# Patient Record
Sex: Female | Born: 1985 | Race: Black or African American | Hispanic: No | Marital: Single | Smoking: Former smoker
Health system: Southern US, Community
[De-identification: ages and names within clinical notes are randomized; demographics above are authoritative.]

## PROBLEM LIST (undated history)

## (undated) ENCOUNTER — Inpatient Hospital Stay (HOSPITAL_COMMUNITY): Payer: Self-pay

## (undated) DIAGNOSIS — R87619 Unspecified abnormal cytological findings in specimens from cervix uteri: Secondary | ICD-10-CM

## (undated) DIAGNOSIS — E7800 Pure hypercholesterolemia, unspecified: Secondary | ICD-10-CM

## (undated) DIAGNOSIS — E669 Obesity, unspecified: Secondary | ICD-10-CM

## (undated) HISTORY — DX: Pure hypercholesterolemia, unspecified: E78.00

## (undated) HISTORY — DX: Unspecified abnormal cytological findings in specimens from cervix uteri: R87.619

## (undated) HISTORY — DX: Obesity, unspecified: E66.9

---
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
## 2010-08-18 ENCOUNTER — Inpatient Hospital Stay (INDEPENDENT_AMBULATORY_CARE_PROVIDER_SITE_OTHER)
Admission: RE | Admit: 2010-08-18 | Discharge: 2010-08-18 | Disposition: A | Discharge status: Home / Self Care | Payer: PRIVATE HEALTH INSURANCE | Source: Ambulatory Visit | Attending: Family Medicine | Admitting: Family Medicine

## 2010-08-18 DIAGNOSIS — N946 Dysmenorrhea, unspecified: Secondary | ICD-10-CM

## 2010-08-18 LAB — POCT URINALYSIS DIP (DEVICE)
Bilirubin Urine: NEGATIVE
Ketones, ur: NEGATIVE mg/dL
Nitrite: NEGATIVE
Protein, ur: 30 mg/dL — AB

## 2010-08-18 LAB — POCT PREGNANCY, URINE: Preg Test, Ur: NEGATIVE

## 2010-08-19 LAB — GC/CHLAMYDIA PROBE AMP, GENITAL: Chlamydia, DNA Probe: NEGATIVE

## 2011-03-29 ENCOUNTER — Encounter: Payer: Self-pay | Admitting: *Deleted

## 2011-03-29 ENCOUNTER — Emergency Department (INDEPENDENT_AMBULATORY_CARE_PROVIDER_SITE_OTHER)
Admission: EM | Admit: 2011-03-29 | Discharge: 2011-03-29 | Disposition: A | Discharge status: Home / Self Care | Payer: Self-pay | Source: Home / Self Care | Attending: Emergency Medicine | Admitting: Emergency Medicine

## 2011-03-29 DIAGNOSIS — J069 Acute upper respiratory infection, unspecified: Secondary | ICD-10-CM

## 2011-03-29 MED ORDER — ACETAMINOPHEN-CODEINE #3 300-30 MG PO TABS: 1.0000 | ORAL_TABLET | Freq: Four times a day (QID) | ORAL | Status: AC | PRN

## 2011-03-29 NOTE — ED Notes (Signed)
pT  HAS  SYMPTOMS  OF  COUGH/  CONGESTION   FEVER  CHILLS  NASAL  STUFFYNESS  AS  WELL AS   DIARRHEA  AND  SOME  STOMACH CRAMPS       SYMPTOMS  X  3  DAYS

## 2011-03-29 NOTE — ED Provider Notes (Signed)
History     CSN: [id number] Arrival date & time: 03/29/2011  8:18 PM   First MD Initiated Contact with Patient 03/29/11 1934      Chief Complaint  Patient presents with  . Cough    (Consider location/radiation/quality/duration/timing/severity/associated sxs/prior treatment) HPI Comments: CONGESTION AND FEVERS X 2-3 DAYS, RUNNY NOSE AND 1-2 EPISODES OF DIARRHEA  Patient is a 25 y.o. female presenting with cough. The history is provided by the patient.  Cough This is a new problem. The current episode started more than 2 days ago. The problem has not changed since onset.The cough is non-productive. There has been no fever. Associated symptoms include chills, ear congestion, headaches and rhinorrhea. Pertinent negatives include no sweats, no ear pain, no shortness of breath and no wheezing. She is not a smoker. Her past medical history does not include bronchitis or asthma.    History reviewed. No pertinent past medical history.  History reviewed. No pertinent past surgical history.  Family History  Problem Relation Age of Onset  . Diabetes Mother   . Hypertension Mother     History  Substance Use Topics  . Smoking status: Current Some Day Smoker  . Smokeless tobacco: Not on file  . Alcohol Use: No    OB History    Grav Para Term Preterm Abortions TAB SAB Ect Mult Living                  Review of Systems  Constitutional: Positive for fever and chills.  HENT: Positive for rhinorrhea. Negative for ear pain.   Respiratory: Positive for cough. Negative for shortness of breath and wheezing.   Neurological: Positive for headaches.    Allergies  Review of patient's allergies indicates no known allergies.  Home Medications   Current Outpatient Rx  Name Route Sig Dispense Refill  . ACETAMINOPHEN 325 MG PO TABS Oral Take 650 mg by mouth every 6 (six) hours as needed.      . ACETAMINOPHEN-CODEINE #3 300-30 MG PO TABS Oral Take 1-2 tablets by mouth every 6 (six) hours as  needed for pain. 15 tablet 0    BP 139/92  Pulse 89  Temp(Src) 99.5 F (37.5 C) (Oral)  Resp 18  SpO2 100%  LMP 03/29/2011  Physical Exam  Nursing note and vitals reviewed. Constitutional: She appears well-developed and well-nourished.  Non-toxic appearance. No distress.  HENT:  Head: Normocephalic.  Right Ear: Tympanic membrane and ear canal normal.  Left Ear: Tympanic membrane and ear canal normal.  Nose: Rhinorrhea present.  Mouth/Throat: Uvula is midline. Posterior oropharyngeal erythema present. No oropharyngeal exudate, posterior oropharyngeal edema or tonsillar abscesses.  Eyes: Conjunctivae are normal. No scleral icterus.  Neck: Normal range of motion. No JVD present.  Cardiovascular: Normal rate.   Pulmonary/Chest: Effort normal and breath sounds normal. She has no decreased breath sounds. She has no wheezes. She has no rhonchi.  Abdominal: Soft.  Lymphadenopathy:    She has no cervical adenopathy.  Neurological: She is alert.  Skin: Skin is warm. No abrasion noted.    ED Course  Procedures (including critical care time)  Labs Reviewed - No data to display No results found.   1. Upper respiratory infection       MDM  URI- X 3 DAYS AFEBRILE. NORMAL Rogene Houston, MD 03/29/11 2042

## 2011-04-19 NOTE — L&D Delivery Note (Signed)
Delivery Note At 12:51 AM a viable female was delivered via Vaginal, Spontaneous Delivery (Presentation: ;  ).  APGAR: , ; weight .   Placenta status: , .  Cord:  with the following complications: .  Cord pH: not done  Anesthesia:   Episiotomy:  Lacerations:  Suture Repair: 2.0 vicryl Est. Blood Loss (mL):   Mom to postpartum.  Baby to nursery-stable.  MARSHALL,BERNARD A 02/28/2012, 1:00 AM

## 2011-05-17 ENCOUNTER — Emergency Department (INDEPENDENT_AMBULATORY_CARE_PROVIDER_SITE_OTHER)
Admission: EM | Admit: 2011-05-17 | Discharge: 2011-05-17 | Disposition: A | Discharge status: Home / Self Care | Payer: PRIVATE HEALTH INSURANCE | Source: Home / Self Care | Attending: Family Medicine | Admitting: Family Medicine

## 2011-05-17 ENCOUNTER — Encounter (HOSPITAL_COMMUNITY): Payer: Self-pay | Admitting: *Deleted

## 2011-05-17 DIAGNOSIS — A6000 Herpesviral infection of urogenital system, unspecified: Secondary | ICD-10-CM

## 2011-05-17 DIAGNOSIS — K649 Unspecified hemorrhoids: Secondary | ICD-10-CM

## 2011-05-17 DIAGNOSIS — B009 Herpesviral infection, unspecified: Secondary | ICD-10-CM

## 2011-05-17 DIAGNOSIS — N898 Other specified noninflammatory disorders of vagina: Secondary | ICD-10-CM

## 2011-05-17 LAB — POCT URINALYSIS DIP (DEVICE)
Glucose, UA: NEGATIVE mg/dL
Nitrite: NEGATIVE
Protein, ur: NEGATIVE mg/dL
Urobilinogen, UA: 0.2 mg/dL (ref 0.0–1.0)

## 2011-05-17 LAB — WET PREP, GENITAL

## 2011-05-17 MED ORDER — VALACYCLOVIR HCL 1 G PO TABS: 1000.0000 mg | ORAL_TABLET | Freq: Two times a day (BID) | ORAL | Status: DC

## 2011-05-17 NOTE — ED Provider Notes (Signed)
Jasmine Krause is a 26 y.o. female who presents to Urgent Care today for hemorrhoids and vaginal discomfort.  She noted hemorrhoids on her anus the last 2 days. She describes a soft bump associated with some pain when defecating. She denies any firm or hard  stools or straining.  She denies any significant pain at rest or itching.  She notes some vaginal pain over the last few days. She has the pain is more external than internal and denies any significant discharge. Her last menstrual period was January 10.  She feels well otherwise without any abdominal pain fever chill or trouble breathing.   PMH reviewed.  ROS as above otherwise neg Medications reviewed. No current facility-administered medications for this encounter.   Current Outpatient Prescriptions  Medication Sig Dispense Refill  . acetaminophen (TYLENOL) 325 MG tablet Take 650 mg by mouth every 6 (six) hours as needed.        . valACYclovir (VALTREX) 1000 MG tablet Take 1 tablet (1,000 mg total) by mouth 2 (two) times daily.  20 tablet  0    Exam:  BP 171/94  Pulse 98  Temp(Src) 99.2 F (37.3 C) (Oral)  Resp 20  SpO2 100%  LMP 04/28/2011 Gen: Well NAD HEENT: EOMI,  MMM Lungs: CTABL Nl WOB Heart: RRR no MRG Abd: NABS, NT, ND GYN: Small circular tender lesion on left labia.  Otherwise normal external genitalia.  Vaginal canal is normal with scant white discharge. Cervix is normal appearing. Anus: Soft nonthrombosed external hemorrhoids present  Assessment and Plan: 26 year old woman with 1) external hemorrhoid.  Advised stool softeners and watchful waiting.  Encouraged to establish primary care doctor for this and other issues.  2) likely herpes: Her pain description and exam is consistent with genital herpes.  Viral culture obtained as well as gonorrhea Chlamydia and wet prep.  Treating empirically with Valtrex 1000 mg twice daily for 10 days.  Encourage her to follow up with her primary care doctor for further  care.     Clementeen Graham, MD 05/17/11 2020

## 2011-05-17 NOTE — ED Notes (Signed)
Pt  Reports     Vaginal  As  Well  As  Pain  In  Her  Rectal  Area    X  2  Days  She  denys  Any  Vaginal  Bleeding  Or  Any    Discharge   She        Is  Awake   And  Alert               Symptoms  X  2  Days   She    Is  Walking upright  And  Appears  In no  Severe  Distress

## 2011-05-18 NOTE — ED Provider Notes (Signed)
Medical screening examination/treatment/procedure(s) were performed by PGY-3 [facility] resident and as supervising physician I was immediately available for consultation/collaboration.   [facility]; MD   Sharin Grave, MD 05/18/11 [id number]

## 2011-05-19 LAB — HERPES SIMPLEX VIRUS CULTURE: Culture: DETECTED

## 2011-05-20 ENCOUNTER — Telehealth (HOSPITAL_COMMUNITY): Payer: Self-pay | Admitting: *Deleted

## 2011-05-20 LAB — GC/CHLAMYDIA PROBE AMP, GENITAL: GC Probe Amp, Genital: NEGATIVE

## 2011-05-20 MED ORDER — FLUCONAZOLE 150 MG PO TABS: 150.0000 mg | ORAL_TABLET | Freq: Once | ORAL | Status: AC

## 2011-05-20 MED ORDER — AZITHROMYCIN 250 MG PO TABS: ORAL_TABLET | ORAL | Status: AC

## 2011-05-20 MED ORDER — METRONIDAZOLE 500 MG PO TABS: 500.0000 mg | ORAL_TABLET | Freq: Two times a day (BID) | ORAL | Status: AC

## 2011-05-20 NOTE — ED Notes (Signed)
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 adequately treated with Acyclovir for Herpes.  Orders obtained from Dr. Juanetta Gosling for Rx.'s of Flagyl, Diflucan and Zithromax. Pt. called back and verified x 2. Pt. given results and told she was adeq. treated with the Acyclovir but needs the 3 other Rx.'s Pt. told no alcohol while taking the Flagyl and to finish Acyclovir for Herpes. Pt. told she can shed the virus even when she does not have an outbreak, so always make her partner wear a condom. Pt. told to get treated for each outbreak and it would be benficial to have an OB-GYN she can call for refill of Acyclovir when she has an outbreak. Pt. instructed to notify her partner about Chlamydia and Herpes, no sex for 1 week and to practice safe sex. Pt. wants Rx.'s called to [facility] on [address]. Rx.'s called to pharmacist @ [phone]. Vassie Moselle 05/20/2011

## 2011-05-20 NOTE — ED Notes (Signed)
Attempted to contact patient at number provided; female answered phone, and asked if we could call back after 6 pm tonight

## 2011-05-23 NOTE — ED Notes (Signed)
DHHS form completed and faxed to the [facility]. Jasmine Krause 05/23/2011

## 2011-05-25 ENCOUNTER — Telehealth: Payer: Self-pay | Admitting: Family Medicine

## 2011-05-25 NOTE — Telephone Encounter (Signed)
Message copied by Rodolph Bong on Wed May 25, 2011  8:26 AM ------      Message from: Vassie Moselle      Created: Thu May 19, 2011  4:47 PM       Pt. adeq. treated for Herpes with Acyclovir.  Pt. has many clue cells and few yeast. Does pt. need tx. for this? GC/chlamydia pending.      Vassie Moselle      05/19/2011

## 2011-09-13 LAB — OB RESULTS CONSOLE GC/CHLAMYDIA
Chlamydia: NEGATIVE
Gonorrhea: NEGATIVE

## 2011-09-13 LAB — OB RESULTS CONSOLE HEPATITIS B SURFACE ANTIGEN: Hepatitis B Surface Ag: NEGATIVE

## 2011-10-02 ENCOUNTER — Encounter (HOSPITAL_COMMUNITY): Payer: Self-pay | Admitting: *Deleted

## 2011-10-02 ENCOUNTER — Inpatient Hospital Stay (HOSPITAL_COMMUNITY)
Admission: AD | Admit: 2011-10-02 | Discharge: 2011-10-02 | Disposition: A | Discharge status: Home / Self Care | Payer: PRIVATE HEALTH INSURANCE | Source: Ambulatory Visit | Attending: Obstetrics | Admitting: Obstetrics

## 2011-10-02 DIAGNOSIS — R112 Nausea with vomiting, unspecified: Secondary | ICD-10-CM

## 2011-10-02 DIAGNOSIS — O210 Mild hyperemesis gravidarum: Secondary | ICD-10-CM | POA: Insufficient documentation

## 2011-10-02 DIAGNOSIS — O219 Vomiting of pregnancy, unspecified: Secondary | ICD-10-CM

## 2011-10-02 DIAGNOSIS — R109 Unspecified abdominal pain: Secondary | ICD-10-CM | POA: Insufficient documentation

## 2011-10-02 DIAGNOSIS — R197 Diarrhea, unspecified: Secondary | ICD-10-CM

## 2011-10-02 DIAGNOSIS — O99891 Other specified diseases and conditions complicating pregnancy: Secondary | ICD-10-CM | POA: Insufficient documentation

## 2011-10-02 LAB — COMPREHENSIVE METABOLIC PANEL
BUN: 5 mg/dL — ABNORMAL LOW (ref 6–23)
CO2: 24 mEq/L (ref 19–32)
Calcium: 8.6 mg/dL (ref 8.4–10.5)
Creatinine, Ser: 0.52 mg/dL (ref 0.50–1.10)
GFR calc Af Amer: >90 mL/min (ref >90)
GFR calc non Af Amer: >90 mL/min (ref >90)
Glucose, Bld: 88 mg/dL (ref 70–99)
Sodium: 136 mEq/L (ref 135–145)
Total Protein: 6.3 g/dL (ref 6.0–8.3)

## 2011-10-02 LAB — CBC
HCT: 33.6 % — ABNORMAL LOW (ref 36.0–46.0)
Hemoglobin: 10.9 g/dL — ABNORMAL LOW (ref 12.0–15.0)
MCHC: 32.4 g/dL (ref 30.0–36.0)
RBC: 4.09 MIL/uL (ref 3.87–5.11)
WBC: 10.8 10*3/uL — ABNORMAL HIGH (ref 4.0–10.5)

## 2011-10-02 LAB — URINALYSIS, ROUTINE W REFLEX MICROSCOPIC
Bilirubin Urine: NEGATIVE
Glucose, UA: NEGATIVE mg/dL
Hgb urine dipstick: NEGATIVE
Ketones, ur: 15 mg/dL — AB
Protein, ur: NEGATIVE mg/dL
Urobilinogen, UA: 0.2 mg/dL (ref 0.0–1.0)

## 2011-10-02 MED ORDER — LACTATED RINGERS IV BOLUS (SEPSIS)
1000.0000 mL | Freq: Once | INTRAVENOUS | Status: AC
  Administered 2011-10-02: 1000 mL via INTRAVENOUS

## 2011-10-02 MED ORDER — PROMETHAZINE HCL 25 MG PO TABS: 12.5000 mg | ORAL_TABLET | Freq: Four times a day (QID) | ORAL | Status: DC | PRN

## 2011-10-02 MED ORDER — ONDANSETRON HCL 4 MG/2ML IJ SOLN
4.0000 mg | Freq: Once | INTRAMUSCULAR | Status: AC
  Administered 2011-10-02: 4 mg via INTRAVENOUS
  Filled 2011-10-02: qty 2

## 2011-10-02 NOTE — MAU Note (Signed)
Plan of care explained to patient, pt verbalized an understanding.

## 2011-10-02 NOTE — Discharge Instructions (Signed)
B.R.A.T. Diet Your doctor has recommended the B.R.A.T. diet for you until the condition improves. This is often used to help control diarrhea and vomiting symptoms. If you or your child can tolerate clear liquids, you may have:  Bananas.   Rice.   Applesauce.   Toast (and other simple starches such as crackers, potatoes, noodles).  Be sure to avoid dairy products, meats, and fatty foods until symptoms are better. Fruit juices such as apple, grape, and prune juice can make diarrhea worse. Avoid these. Continue this diet for 2 days or as instructed by your caregiver. Document Released: 04/04/2005 Document Revised: 03/24/2011 Document Reviewed: 09/21/2006 [facility] Patient Information 2012 [place], [place].

## 2011-10-02 NOTE — MAU Note (Signed)
Pt went to [employer] lastnight and had [place] rice shrimp and chicken.  Pt reports started having N/V about midnight last nigh and has had it off and on all day.

## 2011-10-02 NOTE — MAU Provider Note (Signed)
History     CSN: [id number]  Arrival date & time 10/02/11  1453   None     Chief Complaint  Patient presents with  . Abdominal Cramping   HPI Jasmine Krause is a 26 y.o. female @ [redacted]w[redacted]d gestation who presents to MAU for nausea, vomiting and diarrhea. She ate shrimp, chicken and rice last night and started feeling sick a few hours later. She thinks she may have food poisioning. Vomited x 3 last pm and x 1 today. Had 3 brown watery stools last night and one today.  Denies any other problems. The history was provided by the patient. No past medical history on file.  No past surgical history on file.  Family History  Problem Relation Age of Onset  . Diabetes Mother   . Hypertension Mother   . Other Neg Hx     History  Substance Use Topics  . Smoking status: Current Some Day Smoker  . Smokeless tobacco: Not on file  . Alcohol Use: No    OB History    Grav Para Term Preterm Abortions TAB SAB Ect Mult Living   1               Review of Systems  Constitutional: Negative for fever, chills, diaphoresis and fatigue.  HENT: Negative for ear pain, congestion, sore throat, facial swelling, neck pain, neck stiffness, dental problem and sinus pressure.   Eyes: Negative for photophobia, pain and discharge.  Respiratory: Negative for cough, chest tightness and wheezing.   Gastrointestinal: Positive for nausea, vomiting and diarrhea. Negative for abdominal pain, constipation and abdominal distention.  Genitourinary: Negative for dysuria, frequency, flank pain, vaginal bleeding, vaginal discharge, difficulty urinating and pelvic pain.  Musculoskeletal: Negative for myalgias, back pain and gait problem.  Skin: Negative for color change and rash.  Neurological: Negative for dizziness, speech difficulty, weakness, light-headedness, numbness and headaches.  Psychiatric/Behavioral: Negative for confusion and agitation. The patient is not nervous/anxious.     Allergies  Review of patient's  allergies indicates no known allergies.  Home Medications  No current outpatient prescriptions on file.  BP 133/72  Pulse 131  Temp 99.1 F (37.3 C) (Oral)  Resp 18  Ht 5\' 8"  (1.727 m)  Wt 309 lb 3.2 oz (140.252 kg)  BMI 47.01 kg/m2  LMP 04/28/2011  Physical Exam  Nursing note and vitals reviewed. Constitutional: She is oriented to person, place, and time. No distress.       obese  HENT:  Head: Normocephalic.  Eyes: EOM are normal.  Neck: Neck supple.  Cardiovascular:       tachycardia  Pulmonary/Chest: Effort normal.  Abdominal: Soft. There is no tenderness.       obese  Musculoskeletal: Normal range of motion.  Neurological: She is alert and oriented to person, place, and time. No cranial nerve deficit.  Skin: Skin is warm and dry.  Psychiatric: She has a normal mood and affect. Her behavior is normal. Judgment and thought content normal.   Results for orders placed during the hospital encounter of 10/02/11 (from the past 24 hour(s))  URINALYSIS, ROUTINE W REFLEX MICROSCOPIC     Status: Abnormal   Collection Time   10/02/11  3:10 PM      Component Value Range   Color, Urine YELLOW  YELLOW   APPearance CLEAR  CLEAR   Specific Gravity, Urine 1.010  1.005 - 1.030   pH 6.5  5.0 - 8.0   Glucose, UA NEGATIVE  NEGATIVE mg/dL  Hgb urine dipstick NEGATIVE  NEGATIVE   Bilirubin Urine NEGATIVE  NEGATIVE   Ketones, ur 15 (*) NEGATIVE mg/dL   Protein, ur NEGATIVE  NEGATIVE mg/dL   Urobilinogen, UA 0.2  0.0 - 1.0 mg/dL   Nitrite NEGATIVE  NEGATIVE   Leukocytes, UA NEGATIVE  NEGATIVE  COMPREHENSIVE METABOLIC PANEL     Status: Abnormal   Collection Time   10/02/11  4:07 PM      Component Value Range   Sodium 136  135 - 145 mEq/L   Potassium 3.7  3.5 - 5.1 mEq/L   Chloride 102  96 - 112 mEq/L   CO2 24  19 - 32 mEq/L   Glucose, Bld 88  70 - 99 mg/dL   BUN 5 (*) 6 - 23 mg/dL   Creatinine, Ser 4.09  0.50 - 1.10 mg/dL   Calcium 8.6  8.4 - 81.1 mg/dL   Total Protein 6.3  6.0  - 8.3 g/dL   Albumin 2.8 (*) 3.5 - 5.2 g/dL   AST 17  0 - 37 U/L   ALT 21  0 - 35 U/L   Alkaline Phosphatase 70  39 - 117 U/L   Total Bilirubin 0.2 (*) 0.3 - 1.2 mg/dL   GFR calc non Af Amer >90  >90 mL/min   GFR calc Af Amer >90  >90 mL/min  CBC     Status: Abnormal   Collection Time   10/02/11  4:07 PM      Component Value Range   WBC 10.8 (*) 4.0 - 10.5 K/uL   RBC 4.09  3.87 - 5.11 MIL/uL   Hemoglobin 10.9 (*) 12.0 - 15.0 g/dL   HCT 91.4 (*) 78.2 - 95.6 %   MCV 82.2  78.0 - 100.0 fL   MCH 26.7  26.0 - 34.0 pg   MCHC 32.4  30.0 - 36.0 g/dL   RDW 21.3  08.6 - 57.8 %   Platelets 281  150 - 400 K/uL   Assessment: Nausea, vomiting and diarrhea  Plan:  IV hydration   Zofran IV   ED Course: 16:40 patient feeling much better, no nausea at this time, has had no vomiting or diarrhea since arrival to MAU.  Procedures  Will d/c patient home with Rx for Phenergan and she will follow up with Dr. Gaynell Face. She will return here as needed. MDM

## 2011-10-13 ENCOUNTER — Other Ambulatory Visit (HOSPITAL_COMMUNITY): Payer: Self-pay | Admitting: Obstetrics

## 2011-10-13 DIAGNOSIS — Z3689 Encounter for other specified antenatal screening: Secondary | ICD-10-CM

## 2011-10-18 ENCOUNTER — Ambulatory Visit (HOSPITAL_COMMUNITY)
Admission: RE | Admit: 2011-10-18 | Discharge: 2011-10-18 | Disposition: A | Discharge status: Home / Self Care | Payer: No Typology Code available for payment source | Source: Ambulatory Visit | Attending: Obstetrics | Admitting: Obstetrics

## 2011-10-18 DIAGNOSIS — Z3689 Encounter for other specified antenatal screening: Secondary | ICD-10-CM

## 2011-10-18 DIAGNOSIS — Z363 Encounter for antenatal screening for malformations: Secondary | ICD-10-CM | POA: Insufficient documentation

## 2011-10-18 DIAGNOSIS — Z1389 Encounter for screening for other disorder: Secondary | ICD-10-CM | POA: Insufficient documentation

## 2011-10-18 DIAGNOSIS — O358XX0 Maternal care for other (suspected) fetal abnormality and damage, not applicable or unspecified: Secondary | ICD-10-CM | POA: Insufficient documentation

## 2011-11-11 ENCOUNTER — Other Ambulatory Visit (HOSPITAL_COMMUNITY): Payer: Self-pay | Admitting: Obstetrics

## 2011-11-11 DIAGNOSIS — Z1389 Encounter for screening for other disorder: Secondary | ICD-10-CM

## 2011-11-16 ENCOUNTER — Ambulatory Visit (HOSPITAL_COMMUNITY)
Admission: RE | Admit: 2011-11-16 | Discharge: 2011-11-16 | Disposition: A | Discharge status: Home / Self Care | Payer: No Typology Code available for payment source | Source: Ambulatory Visit | Attending: Obstetrics | Admitting: Obstetrics

## 2011-11-16 DIAGNOSIS — Z1389 Encounter for screening for other disorder: Secondary | ICD-10-CM

## 2011-11-16 DIAGNOSIS — Z3689 Encounter for other specified antenatal screening: Secondary | ICD-10-CM | POA: Insufficient documentation

## 2011-12-12 ENCOUNTER — Other Ambulatory Visit (HOSPITAL_COMMUNITY): Payer: Self-pay | Admitting: Obstetrics

## 2011-12-12 DIAGNOSIS — Z0489 Encounter for examination and observation for other specified reasons: Secondary | ICD-10-CM

## 2011-12-16 ENCOUNTER — Ambulatory Visit (HOSPITAL_COMMUNITY)
Admission: RE | Admit: 2011-12-16 | Discharge: 2011-12-16 | Disposition: A | Discharge status: Home / Self Care | Payer: No Typology Code available for payment source | Source: Ambulatory Visit | Attending: Obstetrics | Admitting: Obstetrics

## 2011-12-16 DIAGNOSIS — Z0489 Encounter for examination and observation for other specified reasons: Secondary | ICD-10-CM

## 2011-12-16 DIAGNOSIS — Z3689 Encounter for other specified antenatal screening: Secondary | ICD-10-CM | POA: Insufficient documentation

## 2011-12-16 DIAGNOSIS — E669 Obesity, unspecified: Secondary | ICD-10-CM | POA: Insufficient documentation

## 2012-01-20 ENCOUNTER — Other Ambulatory Visit (HOSPITAL_COMMUNITY): Payer: Self-pay | Admitting: Obstetrics

## 2012-01-20 DIAGNOSIS — IMO0002 Reserved for concepts with insufficient information to code with codable children: Secondary | ICD-10-CM

## 2012-01-24 ENCOUNTER — Ambulatory Visit (HOSPITAL_COMMUNITY)
Admission: RE | Admit: 2012-01-24 | Discharge: 2012-01-24 | Disposition: A | Discharge status: Home / Self Care | Payer: No Typology Code available for payment source | Source: Ambulatory Visit | Attending: Obstetrics | Admitting: Obstetrics

## 2012-01-24 DIAGNOSIS — Z3689 Encounter for other specified antenatal screening: Secondary | ICD-10-CM | POA: Insufficient documentation

## 2012-01-24 DIAGNOSIS — IMO0002 Reserved for concepts with insufficient information to code with codable children: Secondary | ICD-10-CM

## 2012-01-24 DIAGNOSIS — E669 Obesity, unspecified: Secondary | ICD-10-CM | POA: Insufficient documentation

## 2012-02-23 ENCOUNTER — Telehealth (HOSPITAL_COMMUNITY): Payer: Self-pay | Admitting: *Deleted

## 2012-02-23 ENCOUNTER — Encounter (HOSPITAL_COMMUNITY): Payer: Self-pay | Admitting: *Deleted

## 2012-02-23 NOTE — Telephone Encounter (Signed)
Preadmission screen  

## 2012-02-27 ENCOUNTER — Inpatient Hospital Stay (HOSPITAL_COMMUNITY)
Admission: RE | Admit: 2012-02-27 | Discharge: 2012-03-01 | DRG: 775 | Disposition: A | Discharge status: Home / Self Care | Payer: No Typology Code available for payment source | Source: Ambulatory Visit | Attending: Obstetrics | Admitting: Obstetrics

## 2012-02-27 ENCOUNTER — Encounter (HOSPITAL_COMMUNITY): Payer: Self-pay

## 2012-02-27 ENCOUNTER — Encounter (HOSPITAL_COMMUNITY): Payer: Self-pay | Admitting: Anesthesiology

## 2012-02-27 ENCOUNTER — Inpatient Hospital Stay (HOSPITAL_COMMUNITY): Payer: No Typology Code available for payment source | Admitting: Anesthesiology

## 2012-02-27 DIAGNOSIS — Z22330 Carrier of Group B streptococcus: Secondary | ICD-10-CM

## 2012-02-27 DIAGNOSIS — O99892 Other specified diseases and conditions complicating childbirth: Principal | ICD-10-CM | POA: Diagnosis present

## 2012-02-27 LAB — CBC
HCT: 33.9 % — ABNORMAL LOW (ref 36.0–46.0)
Hemoglobin: 10.9 g/dL — ABNORMAL LOW (ref 12.0–15.0)
RBC: 4.31 MIL/uL (ref 3.87–5.11)
WBC: 12.9 10*3/uL — ABNORMAL HIGH (ref 4.0–10.5)

## 2012-02-27 LAB — RPR: RPR Ser Ql: NONREACTIVE

## 2012-02-27 LAB — TYPE AND SCREEN: ABO/RH(D): B POS

## 2012-02-27 MED ORDER — EPHEDRINE 5 MG/ML INJ: 10.0000 mg | INTRAVENOUS | Status: DC | PRN

## 2012-02-27 MED ORDER — LACTATED RINGERS IV SOLN
500.0000 mL | Freq: Once | INTRAVENOUS | Status: AC
  Administered 2012-02-27: 08:00:00 via INTRAVENOUS

## 2012-02-27 MED ORDER — EPHEDRINE 5 MG/ML INJ
10.0000 mg | INTRAVENOUS | Status: DC | PRN
  Filled 2012-02-27: qty 4

## 2012-02-27 MED ORDER — PHENYLEPHRINE 40 MCG/ML (10ML) SYRINGE FOR IV PUSH (FOR BLOOD PRESSURE SUPPORT): 80.0000 ug | PREFILLED_SYRINGE | INTRAVENOUS | Status: DC | PRN

## 2012-02-27 MED ORDER — BUTORPHANOL TARTRATE 1 MG/ML IJ SOLN: 1.0000 mg | INTRAMUSCULAR | Status: DC | PRN

## 2012-02-27 MED ORDER — SODIUM BICARBONATE 8.4 % IV SOLN
INTRAVENOUS | Status: DC | PRN
  Administered 2012-02-27: 5 mL via EPIDURAL

## 2012-02-27 MED ORDER — PENICILLIN G POTASSIUM 5000000 UNITS IJ SOLR
2.5000 10*6.[IU] | INTRAVENOUS | Status: DC
  Administered 2012-02-27 – 2012-02-28 (×4): 2.5 10*6.[IU] via INTRAVENOUS
  Filled 2012-02-27 (×8): qty 2.5

## 2012-02-27 MED ORDER — LACTATED RINGERS IV SOLN: 500.0000 mL | Freq: Once | INTRAVENOUS | Status: DC

## 2012-02-27 MED ORDER — DIPHENHYDRAMINE HCL 50 MG/ML IJ SOLN: 12.5000 mg | INTRAMUSCULAR | Status: DC | PRN

## 2012-02-27 MED ORDER — PHENYLEPHRINE 40 MCG/ML (10ML) SYRINGE FOR IV PUSH (FOR BLOOD PRESSURE SUPPORT)
80.0000 ug | PREFILLED_SYRINGE | INTRAVENOUS | Status: DC | PRN
  Filled 2012-02-27: qty 5

## 2012-02-27 MED ORDER — TERBUTALINE SULFATE 1 MG/ML IJ SOLN: 0.2500 mg | Freq: Once | INTRAMUSCULAR | Status: AC | PRN

## 2012-02-27 MED ORDER — OXYTOCIN 40 UNITS IN LACTATED RINGERS INFUSION - SIMPLE MED
1.0000 m[IU]/min | INTRAVENOUS | Status: DC
  Administered 2012-02-27: 1 m[IU]/min via INTRAVENOUS
  Filled 2012-02-27: qty 1000

## 2012-02-27 MED ORDER — FENTANYL 2.5 MCG/ML BUPIVACAINE 1/10 % EPIDURAL INFUSION (WH - ANES)
INTRAMUSCULAR | Status: DC | PRN
  Administered 2012-02-27: 14 mL/h via EPIDURAL

## 2012-02-27 MED ORDER — FENTANYL 2.5 MCG/ML BUPIVACAINE 1/10 % EPIDURAL INFUSION (WH - ANES): 14.0000 mL/h | INTRAMUSCULAR | Status: DC

## 2012-02-27 MED ORDER — FENTANYL 2.5 MCG/ML BUPIVACAINE 1/10 % EPIDURAL INFUSION (WH - ANES)
14.0000 mL/h | INTRAMUSCULAR | Status: DC
  Filled 2012-02-27: qty 125

## 2012-02-27 MED ORDER — PENICILLIN G POTASSIUM 5000000 UNITS IJ SOLR
5.0000 10*6.[IU] | Freq: Once | INTRAVENOUS | Status: AC
  Administered 2012-02-27: 5 10*6.[IU] via INTRAVENOUS
  Filled 2012-02-27: qty 5

## 2012-02-27 NOTE — Progress Notes (Signed)
Patient ID: Jasmine Krause, female   DOB: 04/13/1986, 26 y.o.   MRN: [id number] Pt now fully dilated and 0 station

## 2012-02-27 NOTE — Anesthesia Preprocedure Evaluation (Addendum)
Anesthesia Evaluation  Patient identified by MRN, date of birth, ID band Patient awake    Reviewed: Allergy & Precautions, H&P , Patient's Chart, lab work & pertinent test results  Airway Mallampati: III  TM Distance: >3 FB Neck ROM: full    Dental  (+) Teeth Intact   Pulmonary  breath sounds clear to auscultation        Cardiovascular Rhythm:regular Rate:Normal     Neuro/Psych    GI/Hepatic   Endo/Other  Morbid obesity  Renal/GU      Musculoskeletal   Abdominal   Peds  Hematology   Anesthesia Other Findings       Reproductive/Obstetrics (+) Pregnancy                             Anesthesia Physical Anesthesia Plan  ASA: III  Anesthesia Plan: Epidural   Post-op Pain Management:    Induction:   Airway Management Planned:   Additional Equipment:   Intra-op Plan:   Post-operative Plan:   Informed Consent: I have reviewed the patients History and Physical, chart, labs and discussed the procedure including the risks, benefits and alternatives for the proposed anesthesia with the patient or authorized representative who has indicated his/her understanding and acceptance.   Dental Advisory Given  Plan Discussed with:   Anesthesia Plan Comments: (Labs checked- platelets confirmed with RN in room. Fetal heart tracing, per RN, reported to be stable enough for sitting procedure. Discussed epidural, and patient consents to the procedure:  included risk of possible headache,backache, failed block, allergic reaction, and nerve injury. This patient was asked if she had any questions or concerns before the procedure started.)        Anesthesia Quick Evaluation  

## 2012-02-27 NOTE — Anesthesia Procedure Notes (Signed)
Epidural Patient location during procedure: OB  Preanesthetic Checklist Completed: patient identified, site marked, surgical consent, pre-op evaluation, timeout performed, IV checked, risks and benefits discussed and monitors and equipment checked  Epidural Patient position: sitting Prep: site prepped and draped and DuraPrep Patient monitoring: continuous pulse ox and blood pressure Approach: midline Injection technique: LOR air  Needle:  Needle type: Tuohy  Needle gauge: 17 G Needle length: 9 cm and 9 Needle insertion depth: 11 cm Catheter type: closed end flexible Catheter size: 19 Gauge Catheter at skin depth: 10 and 20 cm Test dose: negative  Assessment Events: blood not aspirated, injection not painful, no injection resistance, negative IV test and no paresthesia  Additional Notes Dosing of Epidural:  1st dose, through catheter ............................................Marland Kitchen epi 1:200K + Xylocaine 40 mg  2nd dose, through catheter, after waiting 3 minutes...Marland KitchenMarland Kitchenepi 1:200K + Xylocaine 60 mg    ( 2% Xylo charted as a single dose in Epic Meds for ease of charting; actual dosing was fractionated as above, for saftey's sake)  As each dose occurred, patient was free of IV sx; and patient exhibited no evidence of SA injection.  Patient is more comfortable after epidural dosed. Please see RN's note for documentation of vital signs,and FHR which are stable.  Patient reminded not to try to ambulate with numb legs, and that an RN must be present when she attempts to get up.

## 2012-02-27 NOTE — H&P (Signed)
This is Dr. Francoise Ceo dictating the history and physical on Jasmine Krause she's a 26 year old primigravida at 60 weeks EDC 02/27/2012 and desires induction positive GBS and received penicillin cervix 1-2 cm 80% vertex -2 the history amniotomy was performed the fluids clear she is on low-dose Pitocin Past medical history negative Past surgical history negative Social history negative System review negative Physical examination an obese female in labor HEENT negative Lungs clear to P&A Breasts negative Heart regular rhythm no murmurs no gallops Abdomen term Pelvic as described above Extremities negative

## 2012-02-28 ENCOUNTER — Encounter (HOSPITAL_COMMUNITY): Payer: Self-pay

## 2012-02-28 LAB — CBC
HCT: 30.8 % — ABNORMAL LOW (ref 36.0–46.0)
Hemoglobin: 10 g/dL — ABNORMAL LOW (ref 12.0–15.0)
MCH: 25.2 pg — ABNORMAL LOW (ref 26.0–34.0)
MCHC: 32.5 g/dL (ref 30.0–36.0)
RBC: 3.97 MIL/uL (ref 3.87–5.11)

## 2012-02-28 MED ORDER — BENZOCAINE-MENTHOL 20-0.5 % EX AERO
1.0000 "application " | INHALATION_SPRAY | CUTANEOUS | Status: DC | PRN
  Administered 2012-02-28: 1 via TOPICAL
  Filled 2012-02-28: qty 56

## 2012-02-28 MED ORDER — ONDANSETRON HCL 4 MG PO TABS: 4.0000 mg | ORAL_TABLET | ORAL | Status: DC | PRN

## 2012-02-28 MED ORDER — ONDANSETRON HCL 4 MG/2ML IJ SOLN: 4.0000 mg | INTRAMUSCULAR | Status: DC | PRN

## 2012-02-28 MED ORDER — INFLUENZA VIRUS VACC SPLIT PF IM SUSP
0.5000 mL | INTRAMUSCULAR | Status: AC
  Administered 2012-02-29: 0.5 mL via INTRAMUSCULAR
  Filled 2012-02-28: qty 0.5

## 2012-02-28 MED ORDER — FERROUS SULFATE 325 (65 FE) MG PO TABS
325.0000 mg | ORAL_TABLET | Freq: Two times a day (BID) | ORAL | Status: DC
  Administered 2012-02-28 – 2012-02-29 (×4): 325 mg via ORAL
  Filled 2012-02-28 (×4): qty 1

## 2012-02-28 MED ORDER — DIPHENHYDRAMINE HCL 25 MG PO CAPS: 25.0000 mg | ORAL_CAPSULE | Freq: Four times a day (QID) | ORAL | Status: DC | PRN

## 2012-02-28 MED ORDER — IBUPROFEN 600 MG PO TABS
600.0000 mg | ORAL_TABLET | Freq: Four times a day (QID) | ORAL | Status: DC
  Administered 2012-02-28 – 2012-03-01 (×9): 600 mg via ORAL
  Filled 2012-02-28 (×9): qty 1

## 2012-02-28 MED ORDER — ZOLPIDEM TARTRATE 5 MG PO TABS: 5.0000 mg | ORAL_TABLET | Freq: Every evening | ORAL | Status: DC | PRN

## 2012-02-28 MED ORDER — OXYCODONE-ACETAMINOPHEN 5-325 MG PO TABS: 1.0000 | ORAL_TABLET | ORAL | Status: DC | PRN

## 2012-02-28 MED ORDER — DIBUCAINE 1 % RE OINT: 1.0000 "application " | TOPICAL_OINTMENT | RECTAL | Status: DC | PRN

## 2012-02-28 MED ORDER — TETANUS-DIPHTH-ACELL PERTUSSIS 5-2.5-18.5 LF-MCG/0.5 IM SUSP
0.5000 mL | Freq: Once | INTRAMUSCULAR | Status: AC
  Administered 2012-02-28: 0.5 mL via INTRAMUSCULAR

## 2012-02-28 MED ORDER — SENNOSIDES-DOCUSATE SODIUM 8.6-50 MG PO TABS
2.0000 | ORAL_TABLET | Freq: Every day | ORAL | Status: DC
  Administered 2012-02-28: 2 via ORAL

## 2012-02-28 MED ORDER — PRENATAL MULTIVITAMIN CH
1.0000 | ORAL_TABLET | Freq: Every day | ORAL | Status: DC
  Administered 2012-02-28 – 2012-02-29 (×2): 1 via ORAL
  Filled 2012-02-28 (×2): qty 1

## 2012-02-28 MED ORDER — WITCH HAZEL-GLYCERIN EX PADS: 1.0000 "application " | MEDICATED_PAD | CUTANEOUS | Status: DC | PRN

## 2012-02-28 MED ORDER — SIMETHICONE 80 MG PO CHEW: 80.0000 mg | CHEWABLE_TABLET | ORAL | Status: DC | PRN

## 2012-02-28 MED ORDER — LANOLIN HYDROUS EX OINT: TOPICAL_OINTMENT | CUTANEOUS | Status: DC | PRN

## 2012-02-28 NOTE — Anesthesia Postprocedure Evaluation (Signed)
  Anesthesia Post-op Note  Patient: Jasmine Krause  Procedure(s) Performed: * No procedures listed *  Patient Location: Mother/Baby  Anesthesia Type:Epidural  Level of Consciousness: awake, alert  and oriented  Airway and Oxygen Therapy: Patient Spontanous Breathing  Post-op Pain: none  Post-op Assessment: Post-op Vital signs reviewed, Patient's Cardiovascular Status Stable, No headache, No backache, No residual numbness and No residual motor weakness  Post-op Vital Signs: reviewed and stable  Complications: No apparent anesthesia complications

## 2012-02-28 NOTE — Progress Notes (Signed)
Patient ID: Jasmine Krause, female   DOB: [birth year], 26 y.o.   MRN: [id number] Vital signs normal Fundus firm Legs negative No complaints

## 2012-02-29 NOTE — Progress Notes (Signed)
Patient ID: Jasmine Krause, female   DOB: 1986-03-23, 26 y.o.   MRN: [id number] Postpartum day one Vital signs normal Fundus firm Lochia moderate No complaints

## 2012-03-01 NOTE — Discharge Summary (Signed)
Obstetric Discharge Summary Reason for Admission: induction of labor Prenatal Procedures: none Intrapartum Procedures: spontaneous vaginal delivery Postpartum Procedures: none Complications-Operative and Postpartum: none Hemoglobin  Date Value Range Status  02/28/2012 10.0* 12.0 - 15.0 g/dL Final     HCT  Date Value Range Status  02/28/2012 30.8* 36.0 - 46.0 % Final    Physical Exam:  General: alert Lochia: appropriate Uterine Fundus: firm Incision: healing well DVT Evaluation: No evidence of DVT seen on physical exam.  Discharge Diagnoses: Term Pregnancy-delivered  Discharge Information: Date: 03/01/2012 Activity: pelvic rest Diet: routine Medications: Percocet Condition: stable Instructions: refer to practice specific booklet Discharge to: home Follow-up Information    Call in 6 weeks to follow up.   Contact information:   b marshall         Newborn Data: Live born female  Birth Weight: 6 lb 14.1 oz (3121 g) APGAR: 9, 9  Home with mother.  MARSHALL,BERNARD A 03/01/2012, 7:23 AM

## 2012-03-08 ENCOUNTER — Ambulatory Visit (HOSPITAL_COMMUNITY): Admit: 2012-03-08 | Payer: No Typology Code available for payment source

## 2013-05-15 ENCOUNTER — Ambulatory Visit (INDEPENDENT_AMBULATORY_CARE_PROVIDER_SITE_OTHER): Payer: No Typology Code available for payment source | Admitting: Physician Assistant

## 2013-05-15 VITALS — BP 130/80 | HR 85 | Temp 98.4°F | Resp 16 | Ht 69.0 in | Wt 319.0 lb

## 2013-05-15 DIAGNOSIS — B3731 Acute candidiasis of vulva and vagina: Secondary | ICD-10-CM

## 2013-05-15 DIAGNOSIS — N898 Other specified noninflammatory disorders of vagina: Secondary | ICD-10-CM

## 2013-05-15 DIAGNOSIS — L293 Anogenital pruritus, unspecified: Secondary | ICD-10-CM

## 2013-05-15 DIAGNOSIS — N760 Acute vaginitis: Secondary | ICD-10-CM

## 2013-05-15 DIAGNOSIS — B9689 Other specified bacterial agents as the cause of diseases classified elsewhere: Secondary | ICD-10-CM

## 2013-05-15 DIAGNOSIS — B373 Candidiasis of vulva and vagina: Secondary | ICD-10-CM

## 2013-05-15 LAB — POCT WET PREP WITH KOH
KOH Prep POC: POSITIVE
RBC Wet Prep HPF POC: NEGATIVE
TRICHOMONAS UA: NEGATIVE
Yeast Wet Prep HPF POC: POSITIVE

## 2013-05-15 MED ORDER — METRONIDAZOLE 500 MG PO TABS: 500.0000 mg | ORAL_TABLET | Freq: Two times a day (BID) | ORAL | Status: DC

## 2013-05-15 MED ORDER — FLUCONAZOLE 150 MG PO TABS: 150.0000 mg | ORAL_TABLET | Freq: Once | ORAL | Status: DC
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
## 2013-05-15 NOTE — Patient Instructions (Signed)
Take one Diflucan tablet now.  Take the second dose after you complete the metronidazole.   Avoid sex until you are using a contraceptive method.  I will contact you with your lab results as soon as they are available.   If you have not heard from me in 2 weeks, please contact me.  The fastest way to get your results is to register for My Chart (see the instructions on the last page of this printout).

## 2013-05-17 LAB — GC/CHLAMYDIA PROBE AMP
CT Probe RNA: NEGATIVE
GC PROBE AMP APTIMA: NEGATIVE

## 2013-09-13 IMAGING — US US OB DETAIL+14 WK
1 series · 2 of 2 positions shown · non-contrast
Comparison: none

[Series 1: us ob detail +14 wk · 2 of 2 slices shown]
[im 1/2]
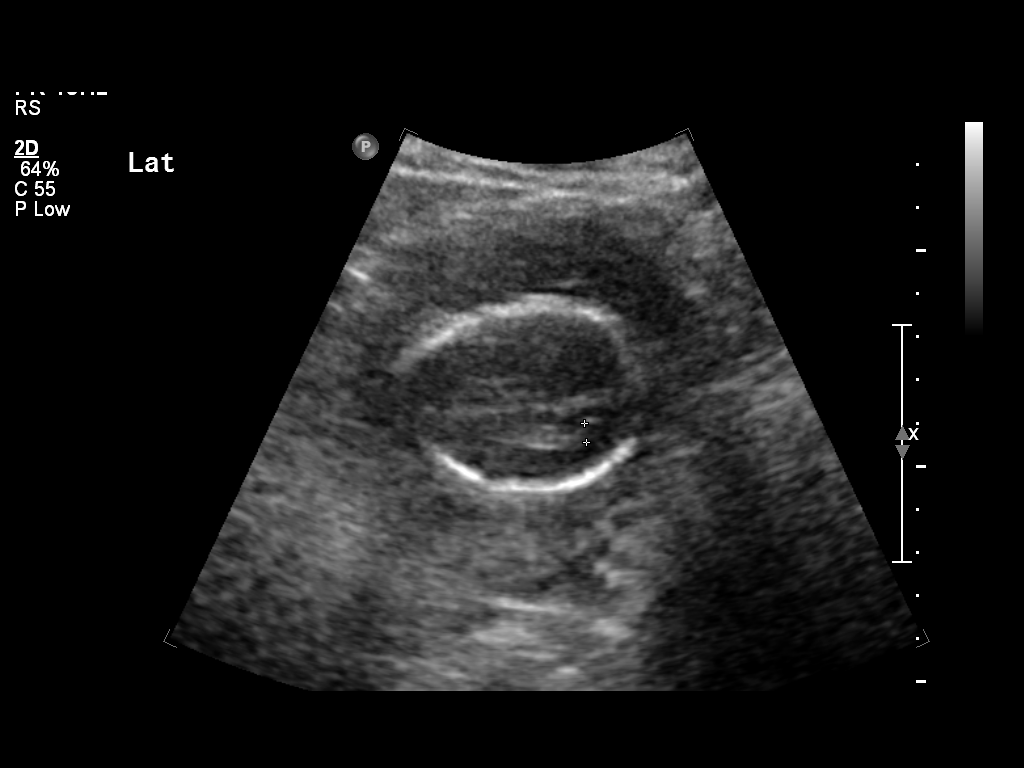
[im 2/2]
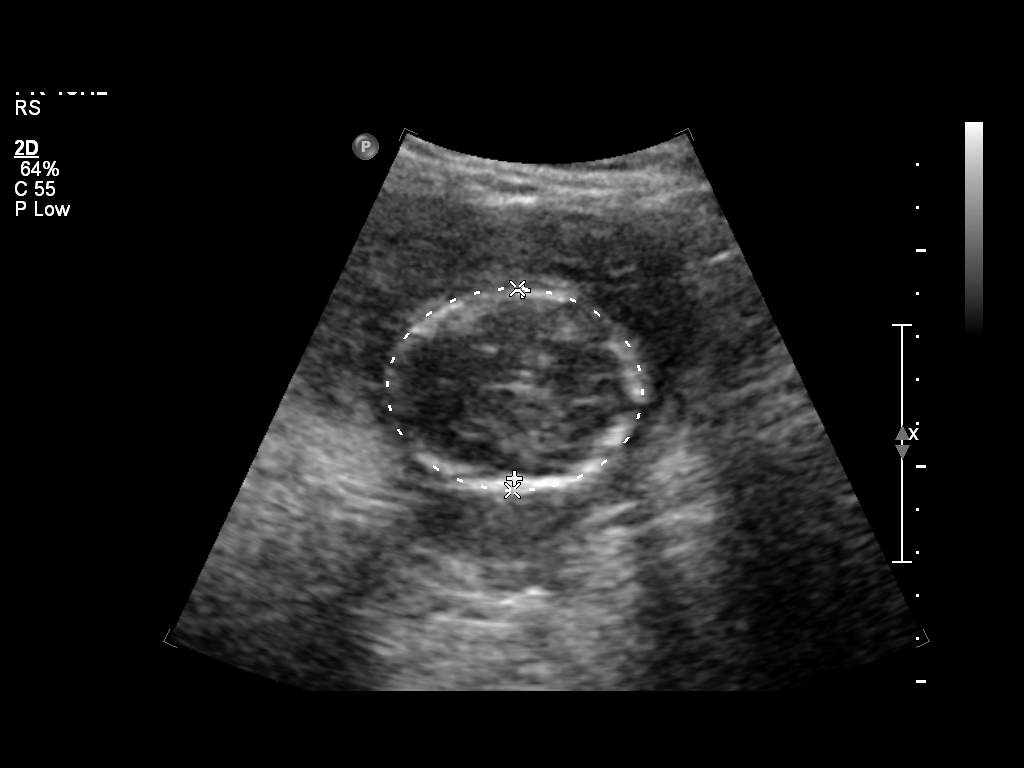

[2 of 2 positions shown; findings below may reference images not displayed]

OBSTETRICS REPORT
                      (Signed Final 10/18/2011 [DATE])

 Order#:         [id number]
Procedures

 US OB DETAIL + 14 WK                                  76811.0
Indications

 Detailed fetal anatomic survey
Fetal Evaluation

 Preg. Location:    Intrauterine
 Fetal Heart Rate:  135                         bpm
 Cardiac Activity:  Observed
 Fetal Lie:         Intrauterine
 Presentation:      Cephalic
 Placenta:          Anterior, above cervical os
 P. Cord            Marginal insertion, 1.2 cm
 Insertion:

 Amniotic Fluid
 AFI FV:      Subjectively within normal limits
                                             Larg Pckt:     3.2  cm
Biometry

 BPD:     44.9  mm    G. Age:   19w 4d                CI:        71.63   70 - 86
                                                      FL/HC:      20.1   15.9 -

 HC:     168.9  mm    G. Age:   19w 4d      < 3  %    HC/AC:      1.15   1.06 -

 AC:     146.7  mm    G. Age:   20w 0d       12  %    FL/BPD:
 FL:        34  mm    G. Age:   20w 5d       25  %    FL/AC:      23.2   20 - 24
 HUM:     31.1  mm    G. Age:   20w 2d       25  %

 Est. FW:     338  gm    0 lb 12 oz      24  %
Gestational Age

 Clinical EDD:  21w 1d                                        EDD:   02/27/12
 U/S Today:     20w 0d                                        EDD:   03/06/12
 Best:          21w 1d    Det. By:   Clinical EDD             EDD:   02/27/12
Anatomy

 Cranium:           Not well            Aortic Arch:       Not well
                    visualized                             visualized
 Fetal Cavum:       Not well            Ductal Arch:       Not well
                    visualized                             visualized
 Ventricles:        Appears normal      Diaphragm:         Not well
                                                           visualized
 Choroid Plexus:    Not well            Stomach:           Appears
                    visualized                             normal, left
                                                           sided
 Cerebellum:        Not well            Abdomen:           Appears normal
                    visualized
 Posterior Fossa:   Not well            Abdominal Wall:    Appears nml
                    visualized                             (cord insert,
                                                           abd wall)
 Nuchal Fold:       Not well            Cord Vessels:      Not well
                    visualized                             visualized
 Face:              Not well            Kidneys:           Not well
                    visualized                             visualized
 Heart:             Not well            Bladder:           Appears normal
                    visualized
 RVOT:              Not well            Spine:             Limited views
                    visualized                             appear normal
 LVOT:              Not well            Limbs:             Four extremities
                    visualized                             seen

 Other:     Fetus appears to be a female. Technically difficult due to
            maternal habitus.
Cervix Uterus Adnexa

 Cervical Length:   3.3       cm

 Cervix:       Normal appearance by transabdominal scan.
 Uterus:       No abnormality visualized.

 Left Ovary:   No adnexal mass visualized.
 Right Ovary:  No adnexal mass visualized.
Impression

  Single living intrauterine gestation with concordant
 gestational age and normal visualized anatomy. The EFW
 today is at the lower limits of normal at the 24th percentile.
 The anatomy scan is very limited today secondary to
 maternal habitus.  As a complete anatomic evaluation was
 not accomplished, sonographic modification of Down
 Syndrome risk was not calculated today. Follow up is
 recommended in 2 weeks.

 Marginal insertion of umbilical cord into placenta.

 questions or concerns.

## 2014-10-31 ENCOUNTER — Encounter (HOSPITAL_COMMUNITY): Payer: Self-pay | Admitting: Oncology

## 2014-10-31 ENCOUNTER — Emergency Department (HOSPITAL_COMMUNITY)
Admission: EM | Admit: 2014-10-31 | Discharge: 2014-10-31 | Disposition: A | Discharge status: Home / Self Care | Payer: No Typology Code available for payment source | Attending: Emergency Medicine | Admitting: Emergency Medicine

## 2014-10-31 DIAGNOSIS — Z720 Tobacco use: Secondary | ICD-10-CM | POA: Insufficient documentation

## 2014-10-31 DIAGNOSIS — Y92410 Unspecified street and highway as the place of occurrence of the external cause: Secondary | ICD-10-CM | POA: Diagnosis not present

## 2014-10-31 DIAGNOSIS — Y9389 Activity, other specified: Secondary | ICD-10-CM | POA: Insufficient documentation

## 2014-10-31 DIAGNOSIS — Z792 Long term (current) use of antibiotics: Secondary | ICD-10-CM | POA: Diagnosis not present

## 2014-10-31 DIAGNOSIS — Y998 Other external cause status: Secondary | ICD-10-CM | POA: Diagnosis not present

## 2014-10-31 DIAGNOSIS — S8991XA Unspecified injury of right lower leg, initial encounter: Secondary | ICD-10-CM | POA: Diagnosis not present

## 2014-10-31 DIAGNOSIS — M25562 Pain in left knee: Secondary | ICD-10-CM

## 2014-10-31 DIAGNOSIS — S8992XA Unspecified injury of left lower leg, initial encounter: Secondary | ICD-10-CM | POA: Diagnosis present

## 2014-10-31 DIAGNOSIS — S299XXA Unspecified injury of thorax, initial encounter: Secondary | ICD-10-CM | POA: Insufficient documentation

## 2014-10-31 DIAGNOSIS — M25561 Pain in right knee: Secondary | ICD-10-CM

## 2014-10-31 MED ORDER — CYCLOBENZAPRINE HCL 10 MG PO TABS
10.0000 mg | ORAL_TABLET | Freq: Once | ORAL | Status: AC
  Administered 2014-10-31: 10 mg via ORAL
  Filled 2014-10-31: qty 1

## 2014-10-31 MED ORDER — NAPROXEN 500 MG PO TABS
500.0000 mg | ORAL_TABLET | Freq: Once | ORAL | Status: AC
  Administered 2014-10-31: 500 mg via ORAL
  Filled 2014-10-31: qty 1

## 2014-10-31 MED ORDER — CYCLOBENZAPRINE HCL 10 MG PO TABS: 10.0000 mg | ORAL_TABLET | Freq: Two times a day (BID) | ORAL | Status: DC | PRN

## 2014-10-31 MED ORDER — MELOXICAM 15 MG PO TABS: 15.0000 mg | ORAL_TABLET | Freq: Every day | ORAL | Status: DC

## 2014-10-31 NOTE — Discharge Instructions (Signed)
Take Mobic as needed for pain. Take Flexeril as needed for muscle spasm. You may take these medications together. Refer to attached documents for more information.  °
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
## 2015-01-12 ENCOUNTER — Encounter: Payer: No Typology Code available for payment source | Admitting: Obstetrics & Gynecology

## 2017-05-26 ENCOUNTER — Telehealth: Payer: Self-pay | Admitting: *Deleted

## 2017-05-26 MED ORDER — NORETHIN ACE-ETH ESTRAD-FE 1.5-30 MG-MCG PO TABS: 1.0000 | ORAL_TABLET | Freq: Every day | ORAL | 0 refills | Status: DC

## 2017-05-26 NOTE — Telephone Encounter (Signed)
[facility] [address]

## 2017-05-26 NOTE — Telephone Encounter (Signed)
Pt called requesting refill on Junel Fe 1.5-30 mcg tablet, annual scheduled on 07/25/17. I asked pt to call me back and let me know what pharmacy she would like Rx sent to. [facility] chart has arrived, last seen for annual on 06/14/16

## 2017-07-21 ENCOUNTER — Other Ambulatory Visit: Payer: Self-pay | Admitting: Obstetrics & Gynecology

## 2017-07-21 NOTE — Telephone Encounter (Signed)
Patient rescheduled to 08/24/17, Rx sent.

## 2017-07-25 ENCOUNTER — Encounter: Payer: No Typology Code available for payment source | Admitting: Obstetrics & Gynecology

## 2017-08-24 ENCOUNTER — Ambulatory Visit (INDEPENDENT_AMBULATORY_CARE_PROVIDER_SITE_OTHER): Payer: No Typology Code available for payment source | Admitting: Obstetrics & Gynecology

## 2017-08-24 ENCOUNTER — Encounter: Payer: Self-pay | Admitting: Obstetrics & Gynecology

## 2017-08-24 VITALS — BP 134/82 | Ht 69.0 in | Wt 352.0 lb

## 2017-08-24 DIAGNOSIS — Z113 Encounter for screening for infections with a predominantly sexual mode of transmission: Secondary | ICD-10-CM

## 2017-08-24 DIAGNOSIS — Z3041 Encounter for surveillance of contraceptive pills: Secondary | ICD-10-CM

## 2017-08-24 DIAGNOSIS — Z6841 Body Mass Index (BMI) 40.0 and over, adult: Secondary | ICD-10-CM | POA: Diagnosis not present

## 2017-08-24 DIAGNOSIS — Z01419 Encounter for gynecological examination (general) (routine) without abnormal findings: Secondary | ICD-10-CM

## 2017-08-24 MED ORDER — NORETHIN ACE-ETH ESTRAD-FE 1.5-30 MG-MCG PO TABS: 1.0000 | ORAL_TABLET | Freq: Every day | ORAL | 4 refills | Status: DC

## 2017-08-24 NOTE — Progress Notes (Signed)
Jasmine Krause 1985/07/22 [id number]   History:    32 y.o. G1P1L1 Boyfriend x 8 yrs, father of daughter.  Daughter 80 yo doing well.  RP:  Established patient presenting for annual gyn exam   HPI: Well on BCPs, normal light menses with no breakthrough bleeding.  No pelvic pain.  Normal vaginal secretions.  Urine and bowel movements normal.  Breasts normal.  Body mass index 51.98.  Started exercising and is on a low calorie diet currently.  Past medical history,surgical history, family history and social history were all reviewed and documented in the EPIC chart.  Gynecologic History Patient's last menstrual period was 08/17/2017. Contraception: OCP (estrogen/progesterone)  Last Pap: 05/2016. Results were: Negative.  Gono-Chlam neg. Last mammogram: Never Bone Density: Never Colonoscopy: Never  Obstetric History OB History  Gravida Para Term Preterm AB Living  0 0 1  SAB TAB Ectopic Multiple Live Births  0 0 0 0 1    # Outcome Date GA Lbr Len/2nd Weight Sex Delivery Anes PTL Lv  1 Term 02/28/12 [redacted]w[redacted]d / 00:41 6 lb 14.1 oz (3.121 kg) F Vag-Spont EPI  LIV     ROS: A ROS was performed and pertinent positives and negatives are included in the history.  GENERAL: No fevers or chills. HEENT: No change in vision, no earache, sore throat or sinus congestion. NECK: No pain or stiffness. CARDIOVASCULAR: No chest pain or pressure. No palpitations. PULMONARY: No shortness of breath, cough or wheeze. GASTROINTESTINAL: No abdominal pain, nausea, vomiting or diarrhea, melena or bright red blood per rectum. GENITOURINARY: No urinary frequency, urgency, hesitancy or dysuria. MUSCULOSKELETAL: No joint or muscle pain, no back pain, no recent trauma. DERMATOLOGIC: No rash, no itching, no lesions. ENDOCRINE: No polyuria, polydipsia, no heat or cold intolerance. No recent change in weight. HEMATOLOGICAL: No anemia or easy bruising or bleeding. NEUROLOGIC: No headache, seizures, numbness, tingling or  weakness. PSYCHIATRIC: No depression, no loss of interest in normal activity or change in sleep pattern.     Exam:   BP 134/82 (BP Location: Right Arm, Patient Position: Sitting, Cuff Size: Large)   Ht  (1.753 m)   Wt (!) 352 lb (159.7 kg)   LMP 08/17/2017   BMI 51.98 kg/m   Body mass index is 51.98 kg/m.  General appearance : Well developed well nourished female. No acute distress HEENT: Eyes: no retinal hemorrhage or exudates,  Neck supple, trachea midline, no carotid bruits, no thyroidmegaly Lungs: Clear to auscultation, no rhonchi or wheezes, or rib retractions  Heart: Regular rate and rhythm, no murmurs or gallops Breast:Examined in sitting and supine position were symmetrical in appearance, no palpable masses or tenderness,  no skin retraction, no nipple inversion, no nipple discharge, no skin discoloration, no axillary or supraclavicular lymphadenopathy Abdomen: no palpable masses or tenderness, no rebound or guarding Extremities: no edema or skin discoloration or tenderness  Pelvic: Vulva: Normal             Vagina: No gross lesions or discharge  Cervix: No gross lesions or discharge.  Pap reflex/Gono-Chlam  Uterus  RV, normal size, shape and consistency, non-tender and mobile  Adnexa  Without masses or tenderness  Anus: Normal   Assessment/Plan:  32 y.o. female for annual exam   1. Encounter for routine gynecological examination with Papanicolaou smear of cervix Normal gynecologic exam.  Pap reflex done.  Breast exam normal.  2. Encounter for surveillance of contraceptive pills Well on birth control pills.  No  contraindication.  Same birth control pill prescribed which is norethindrone-Ethinyl estradiol with iron 1.5/30.  3. Class 3 severe obesity due to excess calories without serious comorbidity with body mass index (BMI) of 50.0 to 59.9 in adult [facility]) Morbid obesity with a body mass index of 51.98.  Regular physical activity with aerobic activities 5 times a  week and weightlifting every 2 days recommended.  Low calorie/low sugar diet such as [employer] recommended.  Other orders - norethindrone-ethinyl estradiol-iron (BLISOVI FE 1.5/30) 1.5-30 MG-MCG tablet; Take 1 tablet by mouth daily.  Genia Del MD, 8:33 AM 08/24/2017

## 2017-08-25 ENCOUNTER — Encounter: Payer: Self-pay | Admitting: Obstetrics & Gynecology

## 2017-08-25 LAB — PAP THINPREP ASCUS RFLX HPV RFLX TYPE
C. trachomatis RNA, TMA: NOT DETECTED
N. GONORRHOEAE RNA, TMA: NOT DETECTED

## 2017-08-25 NOTE — Patient Instructions (Signed)
1. Encounter for routine gynecological examination with Papanicolaou smear of cervix Normal gynecologic exam.  Pap reflex done.  Breast exam normal.  2. Encounter for surveillance of contraceptive pills Well on birth control pills.  No contraindication.  Same birth control pill prescribed which is norethindrone-Ethinyl estradiol with iron 1.5/30.  3. Class 3 severe obesity due to excess calories without serious comorbidity with body mass index (BMI) of 50.0 to 59.9 in adult [facility]) Morbid obesity with a body mass index of 51.98.  Regular physical activity with aerobic activities 5 times a week and weightlifting every 2 days recommended.  Low calorie/low sugar diet such as [employer] recommended.  Other orders - norethindrone-ethinyl estradiol-iron (BLISOVI FE 1.5/30) 1.5-30 MG-MCG tablet; Take 1 tablet by mouth daily.  Jasmine Krause, it was a pleasure seeing you today!  I will inform you of your results as soon as they are available.   Calorie Counting for Weight Loss Calories are units of energy. Your body needs a certain amount of calories from food to keep you going throughout the day. When you eat more calories than your body needs, your body stores the extra calories as fat. When you eat fewer calories than your body needs, your body burns fat to get the energy it needs. Calorie counting means keeping track of how many calories you eat and drink each day. Calorie counting can be helpful if you need to lose weight. If you make sure to eat fewer calories than your body needs, you should lose weight. Ask your health care provider what a healthy weight is for you. For calorie counting to work, you will need to eat the right number of calories in a day in order to lose a healthy amount of weight per week. A dietitian can help you determine how many calories you need in a day and will give you suggestions on how to reach your calorie goal.  A healthy amount of weight to lose per week is usually 1-2  lb (0.5-0.9 kg). This usually means that your daily calorie intake should be reduced by 500-750 calories.  Eating 1,200 - 1,500 calories per day can help most women lose weight.  Eating 1,500 - 1,800 calories per day can help most men lose weight.  What is my plan? My goal is to have __________ calories per day. If I have this many calories per day, I should lose around __________ pounds per week. What do I need to know about calorie counting? In order to meet your daily calorie goal, you will need to:  Find out how many calories are in each food you would like to eat. Try to do this before you eat.  Decide how much of the food you plan to eat.  Write down what you ate and how many calories it had. Doing this is called keeping a food log.  To successfully lose weight, it is important to balance calorie counting with a healthy lifestyle that includes regular activity. Aim for 150 minutes of moderate exercise (such as walking) or 75 minutes of vigorous exercise (such as running) each week. Where do I find calorie information?  The number of calories in a food can be found on a Nutrition Facts label. If a food does not have a Nutrition Facts label, try to look up the calories online or ask your dietitian for help. Remember that calories are listed per serving. If you choose to have more than one serving of a food, you will have to  multiply the calories per serving by the amount of servings you plan to eat. For example, the label on a package of bread might say that a serving size is 1 slice and that there are 90 calories in a serving. If you eat 1 slice, you will have eaten 90 calories. If you eat 2 slices, you will have eaten 180 calories. How do I keep a food log? Immediately after each meal, record the following information in your food log:  What you ate. Don't forget to include toppings, sauces, and other extras on the food.  How much you ate. This can be measured in cups, ounces, or  number of items.  How many calories each food and drink had.  The total number of calories in the meal.  Keep your food log near you, such as in a small notebook in your pocket, or use a mobile app or website. Some programs will calculate calories for you and show you how many calories you have left for the day to meet your goal. What are some calorie counting tips?  Use your calories on foods and drinks that will fill you up and not leave you hungry: ? Some examples of foods that fill you up are nuts and nut butters, vegetables, lean proteins, and high-fiber foods like whole grains. High-fiber foods are foods with more than 5 g fiber per serving. ? Drinks such as sodas, specialty coffee drinks, alcohol, and juices have a lot of calories, yet do not fill you up.  Eat nutritious foods and avoid empty calories. Empty calories are calories you get from foods or beverages that do not have many vitamins or protein, such as candy, sweets, and soda. It is better to have a nutritious high-calorie food (such as an avocado) than a food with few nutrients (such as a bag of chips).  Know how many calories are in the foods you eat most often. This will help you calculate calorie counts faster.  Pay attention to calories in drinks. Low-calorie drinks include water and unsweetened drinks.  Pay attention to nutrition labels for "low fat" or "fat free" foods. These foods sometimes have the same amount of calories or more calories than the full fat versions. They also often have added sugar, starch, or salt, to make up for flavor that was removed with the fat.  Find a way of tracking calories that works for you. Get creative. Try different apps or programs if writing down calories does not work for you. What are some portion control tips?  Know how many calories are in a serving. This will help you know how many servings of a certain food you can have.  Use a measuring cup to measure serving sizes. You could  also try weighing out portions on a kitchen scale. With time, you will be able to estimate serving sizes for some foods.  Take some time to put servings of different foods on your favorite plates, bowls, and cups so you know what a serving looks like.  Try not to eat straight from a bag or box. Doing this can lead to overeating. Put the amount you would like to eat in a cup or on a plate to make sure you are eating the right portion.  Use smaller plates, glasses, and bowls to prevent overeating.  Try not to multitask (for example, watch TV or use your computer) while eating. If it is time to eat, sit down at a table and enjoy your food. This  will help you to know when you are full. It will also help you to be aware of what you are eating and how much you are eating. What are tips for following this plan? Reading food labels  Check the calorie count compared to the serving size. The serving size may be smaller than what you are used to eating.  Check the source of the calories. Make sure the food you are eating is high in vitamins and protein and low in saturated and trans fats. Shopping  Read nutrition labels while you shop. This will help you make healthy decisions before you decide to purchase your food.  Make a grocery list and stick to it. Cooking  Try to cook your favorite foods in a healthier way. For example, try baking instead of frying.  Use low-fat dairy products. Meal planning  Use more fruits and vegetables. Half of your plate should be fruits and vegetables.  Include lean proteins like poultry and fish. How do I count calories when eating out?  Ask for smaller portion sizes.  Consider sharing an entree and sides instead of getting your own entree.  If you get your own entree, eat only half. Ask for a box at the beginning of your meal and put the rest of your entree in it so you are not tempted to eat it.  If calories are listed on the menu, choose the lower calorie  options.  Choose dishes that include vegetables, fruits, whole grains, low-fat dairy products, and lean protein.  Choose items that are boiled, broiled, grilled, or steamed. Stay away from items that are buttered, battered, fried, or served with cream sauce. Items labeled "crispy" are usually fried, unless stated otherwise.  Choose water, low-fat milk, unsweetened iced tea, or other drinks without added sugar. If you want an alcoholic beverage, choose a lower calorie option such as a glass of wine or light beer.  Ask for dressings, sauces, and syrups on the side. These are usually high in calories, so you should limit the amount you eat.  If you want a salad, choose a garden salad and ask for grilled meats. Avoid extra toppings like bacon, cheese, or fried items. Ask for the dressing on the side, or ask for olive oil and vinegar or lemon to use as dressing.  Estimate how many servings of a food you are given. For example, a serving of cooked rice is  cup or about the size of half a baseball. Knowing serving sizes will help you be aware of how much food you are eating at restaurants. The list below tells you how big or small some common portion sizes are based on everyday objects: ? 1 oz-4 stacked dice. ? 3 oz-1 deck of cards. ? 1 tsp-1 die. ? 1 Tbsp- a ping-pong ball. ? 2 Tbsp-1 ping-pong ball. ?  cup- baseball. ? 1 cup-1 baseball. Summary  Calorie counting means keeping track of how many calories you eat and drink each day. If you eat fewer calories than your body needs, you should lose weight.  A healthy amount of weight to lose per week is usually 1-2 lb (0.5-0.9 kg). This usually means reducing your daily calorie intake by 500-750 calories.  The number of calories in a food can be found on a Nutrition Facts label. If a food does not have a Nutrition Facts label, try to look up the calories online or ask your dietitian for help.  Use your calories on foods and drinks that will  fill  you up, and not on foods and drinks that will leave you hungry.  Use smaller plates, glasses, and bowls to prevent overeating. This information is not intended to replace advice given to you by your health care provider. Make sure you discuss any questions you have with your health care provider. Document Released: 04/04/2005 Document Revised: 03/04/2016 Document Reviewed: 03/04/2016 Elsevier Interactive Patient Education  2018 [employer].  Exercising to [employer] Exercising can help you to lose weight. In order to lose weight through exercise, you need to do vigorous-intensity exercise. You can tell that you are exercising with vigorous intensity if you are breathing very hard and fast and cannot hold a conversation while exercising. Moderate-intensity exercise helps to maintain your current weight. You can tell that you are exercising at a moderate level if you have a higher heart rate and faster breathing, but you are still able to hold a conversation. How often should I exercise? Choose an activity that you enjoy and set realistic goals. Your health care provider can help you to make an activity plan that works for you. Exercise regularly as directed by your health care provider. This may include:  Doing resistance training twice each week, such as: ? Push-ups. ? Sit-ups. ? Lifting weights. ? Using resistance bands.  Doing a given intensity of exercise for a given amount of time. Choose from these options: ? 150 minutes of moderate-intensity exercise every week. ? 75 minutes of vigorous-intensity exercise every week. ? A mix of moderate-intensity and vigorous-intensity exercise every week.  Children, pregnant women, people who are out of shape, people who are overweight, and older adults may need to consult a health care provider for individual recommendations. If you have any sort of medical condition, be sure to consult your health care provider before starting a new exercise  program. What are some activities that can help me to lose weight?  Walking at a rate of at least 4.5 miles an hour.  Jogging or running at a rate of 5 miles per hour.  Biking at a rate of at least 10 miles per hour.  Lap swimming.  Roller-skating or in-line skating.  Cross-country skiing.  Vigorous competitive sports, such as football, basketball, and soccer.  Jumping rope.  Aerobic dancing. How can I be more active in my day-to-day activities?  Use the stairs instead of the elevator.  Take a walk during your lunch break.  If you drive, park your car farther away from work or school.  If you take public transportation, get off one stop early and walk the rest of the way.  Make all of your phone calls while standing up and walking around.  Get up, stretch, and walk around every 30 minutes throughout the day. What guidelines should I follow while exercising?  Do not exercise so much that you hurt yourself, feel dizzy, or get very short of breath.  Consult your health care provider prior to starting a new exercise program.  Wear comfortable clothes and shoes with good support.  Drink plenty of water while you exercise to prevent dehydration or heat stroke. Body water is lost during exercise and must be replaced.  Work out until you breathe faster and your heart beats faster. This information is not intended to replace advice given to you by your health care provider. Make sure you discuss any questions you have with your health care provider. Document Released: 05/07/2010 Document Revised: 09/10/2015 Document Reviewed: 09/05/2013 Elsevier Interactive Patient Education  2018  [employer].

## 2017-09-16 ENCOUNTER — Other Ambulatory Visit: Payer: Self-pay | Admitting: Obstetrics & Gynecology

## 2018-08-27 ENCOUNTER — Other Ambulatory Visit: Payer: Self-pay

## 2018-08-27 ENCOUNTER — Ambulatory Visit (INDEPENDENT_AMBULATORY_CARE_PROVIDER_SITE_OTHER): Payer: No Typology Code available for payment source | Admitting: Obstetrics & Gynecology

## 2018-08-27 ENCOUNTER — Encounter: Payer: Self-pay | Admitting: Obstetrics & Gynecology

## 2018-08-27 VITALS — BP 136/82 | Ht 68.5 in | Wt 384.0 lb

## 2018-08-27 DIAGNOSIS — Z1151 Encounter for screening for human papillomavirus (HPV): Secondary | ICD-10-CM

## 2018-08-27 DIAGNOSIS — Z3041 Encounter for surveillance of contraceptive pills: Secondary | ICD-10-CM

## 2018-08-27 DIAGNOSIS — Z01419 Encounter for gynecological examination (general) (routine) without abnormal findings: Secondary | ICD-10-CM | POA: Diagnosis not present

## 2018-08-27 DIAGNOSIS — Z113 Encounter for screening for infections with a predominantly sexual mode of transmission: Secondary | ICD-10-CM

## 2018-08-27 DIAGNOSIS — Z6841 Body Mass Index (BMI) 40.0 and over, adult: Secondary | ICD-10-CM

## 2018-08-27 MED ORDER — NORETHIN ACE-ETH ESTRAD-FE 1.5-30 MG-MCG PO TABS: 1.0000 | ORAL_TABLET | Freq: Every day | ORAL | 4 refills | Status: DC

## 2018-08-27 NOTE — Addendum Note (Signed)
Addended by: Berna Spare A on: 08/27/2018 03:25 PM   Modules accepted: Orders

## 2018-08-27 NOTE — Patient Instructions (Signed)
1. Encounter for routine gynecological examination with Papanicolaou smear of cervix Normal gynecologic exam.  Pap test with high-risk HPV done today.  Breast exam normal.  2. Encounter for surveillance of contraceptive pills Well on birth control pills, no contraindication to continue.  Blisovi represcribed.  3. Screen for STD (sexually transmitted disease) - C. trachomatis/N. gonorrhoeae RNA  4. Class 3 severe obesity due to excess calories without serious comorbidity with body mass index (BMI) of 50.0 to 59.9 in adult [facility]) Morbid obesity.  Per patient, losing weight on Nutrisystem.  We will continue with a lower calorie/carb diet and increase physical activities with aerobic activities 5 times a week and weightlifting every 2 days.  Other orders - norethindrone-ethinyl estradiol-iron (BLISOVI FE 1.5/30) 1.5-30 MG-MCG tablet; Take 1 tablet by mouth daily.  Liberta, it was a pleasure seeing you today!  I will inform you of your results as soon as they are available.

## 2018-08-27 NOTE — Progress Notes (Signed)
Jasmine ProudRashea L Branan 24-Oct-1985 [id number]   History:    33 y.o. G1P1L1 stable partner/FOB.  Daughter 486 yo.  RP:  Established patient presenting for annual gyn exam   HPI: Well on BCPs with Blisovi.  No BTB.  No pelvic pain.  No pain with IC.  Urine/BMs normal.  Breasts normal.  BMI 57.54.  Started NutriSystem, per patient, lost 20 Lbs in 2 months.    Past medical history,surgical history, family history and social history were all reviewed and documented in the EPIC chart.  Gynecologic History Patient's last menstrual period was 08/15/2018. Contraception: OCP (estrogen/progesterone) Last Pap: 08/2017. Results were: Negative Last mammogram: Never Bone Density: Never Colonoscopy: Never  Obstetric History OB History  Gravida Para Term Preterm AB Living  1 1 1  0 0 1  SAB TAB Ectopic Multiple Live Births  0 0 0 0 1    # Outcome Date GA Lbr Len/2nd Weight Sex Delivery Anes PTL Lv  1 Term 02/28/12 [id number] / 00:41 6 lb 14.1 oz (3.121 kg) F Vag-Spont EPI  LIV     ROS: A ROS was performed and pertinent positives and negatives are included in the history.  GENERAL: No fevers or chills. HEENT: No change in vision, no earache, sore throat or sinus congestion. NECK: No pain or stiffness. CARDIOVASCULAR: No chest pain or pressure. No palpitations. PULMONARY: No shortness of breath, cough or wheeze. GASTROINTESTINAL: No abdominal pain, nausea, vomiting or diarrhea, melena or bright red blood per rectum. GENITOURINARY: No urinary frequency, urgency, hesitancy or dysuria. MUSCULOSKELETAL: No joint or muscle pain, no back pain, no recent trauma. DERMATOLOGIC: No rash, no itching, no lesions. ENDOCRINE: No polyuria, polydipsia, no heat or cold intolerance. No recent change in weight. HEMATOLOGICAL: No anemia or easy bruising or bleeding. NEUROLOGIC: No headache, seizures, numbness, tingling or weakness. PSYCHIATRIC: No depression, no loss of interest in normal activity or change in sleep pattern.      Exam:   BP 136/82   Ht 5' 8.5" (1.74 m)   Wt (!) 384 lb (174.2 kg)   LMP 08/15/2018 Comment: pill  BMI 57.54 kg/m   Body mass index is 57.54 kg/m.  General appearance : Well developed well nourished female. No acute distress HEENT: Eyes: no retinal hemorrhage or exudates,  Neck supple, trachea midline, no carotid bruits, no thyroidmegaly Lungs: Clear to auscultation, no rhonchi or wheezes, or rib retractions  Heart: Regular rate and rhythm, no murmurs or gallops Breast:Examined in sitting and supine position were symmetrical in appearance, no palpable masses or tenderness,  no skin retraction, no nipple inversion, no nipple discharge, no skin discoloration, no axillary or supraclavicular lymphadenopathy Abdomen: no palpable masses or tenderness, no rebound or guarding Extremities: no edema or skin discoloration or tenderness  Pelvic: Vulva: Normal             Vagina: No gross lesions or discharge  Cervix: No gross lesions or discharge.  Pap/HPV HR, Gono-Chlam done.  Uterus  RV, normal size, shape and consistency, non-tender and mobile  Adnexa  Without masses or tenderness  Anus: Normal   Assessment/Plan:  33 y.o. female for annual exam   1. Encounter for routine gynecological examination with Papanicolaou smear of cervix Normal gynecologic exam.  Pap test with high-risk HPV done today.  Breast exam normal.  2. Encounter for surveillance of contraceptive pills Well on birth control pills, no contraindication to continue.  Blisovi represcribed.  3. Screen for STD (sexually transmitted disease) - C. trachomatis/N. gonorrhoeae RNA  4. Class 3 severe obesity due to excess calories without serious comorbidity with body mass index (BMI) of 50.0 to 59.9 in adult [facility]) Morbid obesity.  Per patient, losing weight on Nutrisystem.  We will continue with a lower calorie/carb diet and increase physical activities with aerobic activities 5 times a week and weightlifting every 2 days.   Other orders - norethindrone-ethinyl estradiol-iron (BLISOVI FE 1.5/30) 1.5-30 MG-MCG tablet; Take 1 tablet by mouth daily.  Genia Del MD, 2:07 PM 08/27/2018

## 2018-08-28 NOTE — Addendum Note (Signed)
Addended by: Rushie Goltz on: 08/28/2018 08:39 AM   Modules accepted: Orders

## 2018-08-29 LAB — PAP IG, CT-NG NAA, HPV HIGH-RISK
C. trachomatis RNA, TMA: NOT DETECTED
HPV DNA High Risk: DETECTED — AB
N. gonorrhoeae RNA, TMA: NOT DETECTED

## 2018-09-04 ENCOUNTER — Other Ambulatory Visit: Payer: Self-pay

## 2018-09-04 MED ORDER — TINIDAZOLE 500 MG PO TABS: ORAL_TABLET | ORAL | 0 refills | Status: DC

## 2018-09-13 ENCOUNTER — Other Ambulatory Visit: Payer: Self-pay | Admitting: Obstetrics & Gynecology

## 2018-09-17 ENCOUNTER — Ambulatory Visit: Payer: No Typology Code available for payment source | Admitting: Obstetrics & Gynecology

## 2018-10-15 ENCOUNTER — Other Ambulatory Visit: Payer: Self-pay | Admitting: Obstetrics & Gynecology

## 2019-03-22 ENCOUNTER — Other Ambulatory Visit: Payer: Self-pay | Admitting: General Practice

## 2019-03-22 DIAGNOSIS — Z20822 Contact with and (suspected) exposure to covid-19: Secondary | ICD-10-CM

## 2019-03-23 LAB — NOVEL CORONAVIRUS, NAA: SARS-CoV-2, NAA: NOT DETECTED

## 2019-08-30 ENCOUNTER — Encounter: Payer: No Typology Code available for payment source | Admitting: Obstetrics & Gynecology

## 2019-09-13 ENCOUNTER — Other Ambulatory Visit: Payer: Self-pay

## 2019-09-13 MED ORDER — NORETHIN ACE-ETH ESTRAD-FE 1.5-30 MG-MCG PO TABS: 1.0000 | ORAL_TABLET | Freq: Every day | ORAL | 0 refills | Status: DC

## 2019-09-13 NOTE — Telephone Encounter (Signed)
Annual exam is scheduled 09/23/19.

## 2019-09-23 ENCOUNTER — Encounter: Payer: Self-pay | Admitting: Obstetrics & Gynecology

## 2019-09-23 ENCOUNTER — Other Ambulatory Visit: Payer: Self-pay

## 2019-09-23 ENCOUNTER — Ambulatory Visit (INDEPENDENT_AMBULATORY_CARE_PROVIDER_SITE_OTHER): Payer: PRIVATE HEALTH INSURANCE | Admitting: Obstetrics & Gynecology

## 2019-09-23 VITALS — BP 130/80 | Ht 68.5 in | Wt 330.0 lb

## 2019-09-23 DIAGNOSIS — Z1151 Encounter for screening for human papillomavirus (HPV): Secondary | ICD-10-CM

## 2019-09-23 DIAGNOSIS — Z01419 Encounter for gynecological examination (general) (routine) without abnormal findings: Secondary | ICD-10-CM

## 2019-09-23 DIAGNOSIS — Z3041 Encounter for surveillance of contraceptive pills: Secondary | ICD-10-CM | POA: Diagnosis not present

## 2019-09-23 DIAGNOSIS — Z113 Encounter for screening for infections with a predominantly sexual mode of transmission: Secondary | ICD-10-CM

## 2019-09-23 DIAGNOSIS — Z6841 Body Mass Index (BMI) 40.0 and over, adult: Secondary | ICD-10-CM

## 2019-09-23 MED ORDER — NORETHIN ACE-ETH ESTRAD-FE 1.5-30 MG-MCG PO TABS: 1.0000 | ORAL_TABLET | Freq: Every day | ORAL | 4 refills | Status: DC

## 2019-09-23 NOTE — Patient Instructions (Signed)
1. Encounter for routine gynecological examination with Papanicolaou smear of cervix Normal gynecologic exam.  Pap test/HR HPV done today.  ASCUS/HPV HR pos last year.  If second abnormal pap or HPV HR pos, will f/u for Colposcopy.  Breasts normal.   - Pap IG, CT/NG NAA, and HPV (high risk)  2. Special screening examination for human papillomavirus (HPV) - Pap IG, CT/NG NAA, and HPV (high risk)  3. Encounter for surveillance of contraceptive pills Well on Blisovi FE 1.5/30.  No contraindication to continue.  Prescription sent to pharmacy.  4. Screening examination for venereal disease - Pap IG, CT/NG NAA, and HPV (high risk)  5. Class 3 severe obesity due to excess calories without serious comorbidity with body mass index (BMI) of 45.0 to 49.9 in adult [facility]) Patient lost 54 pounds since last year on the lower calorie/carb diet.  Increased her water intake.  Recommend to continue.  Aerobic activities 5 times a week and light weightlifting every 2 days.  Other orders - norethindrone-ethinyl estradiol-iron (BLISOVI FE 1.5/30) 1.5-30 MG-MCG tablet; Take 1 tablet by mouth daily.  Jasmine Krause, it was a pleasure seeing you today!  I will inform you of your results as soon as they are available.

## 2019-09-23 NOTE — Progress Notes (Signed)
Jasmine Krause 03/20/1986 [id number]   History:    34 y.o. G1P1L1 stable partner/FOB.  Daughter 61 yo.  RP:  Established patient presenting for annual gyn exam   HPI: Well on BCPs with Blisovi.  No BTB.  No pelvic pain.  No pain with IC.  Urine/BMs normal.  Breasts normal.  BMI 57.54 last year, down to 49.45 now.  Lost 54 Lbs x last year.  Decreased overall calories and drinks more water.      Past medical history,surgical history, family history and social history were all reviewed and documented in the EPIC chart.  Gynecologic History Patient's last menstrual period was 09/11/2019.  Obstetric History OB History  Gravida Para Term Preterm AB Living  1 1 1  0 0 1  SAB TAB Ectopic Multiple Live Births  0 0 0 0 1    # Outcome Date GA Lbr Len/2nd Weight Sex Delivery Anes PTL Lv  1 Term 02/28/12 [redacted]w[redacted]d / 00:41 6 lb 14.1 oz (3.121 kg) F Vag-Spont EPI  LIV     ROS: A ROS was performed and pertinent positives and negatives are included in the history.  GENERAL: No fevers or chills. HEENT: No change in vision, no earache, sore throat or sinus congestion. NECK: No pain or stiffness. CARDIOVASCULAR: No chest pain or pressure. No palpitations. PULMONARY: No shortness of breath, cough or wheeze. GASTROINTESTINAL: No abdominal pain, nausea, vomiting or diarrhea, melena or bright red blood per rectum. GENITOURINARY: No urinary frequency, urgency, hesitancy or dysuria. MUSCULOSKELETAL: No joint or muscle pain, no back pain, no recent trauma. DERMATOLOGIC: No rash, no itching, no lesions. ENDOCRINE: No polyuria, polydipsia, no heat or cold intolerance. No recent change in weight. HEMATOLOGICAL: No anemia or easy bruising or bleeding. NEUROLOGIC: No headache, seizures, numbness, tingling or weakness. PSYCHIATRIC: No depression, no loss of interest in normal activity or change in sleep pattern.     Exam:   BP (!) 146/88   Ht 5' 8.5" (1.74 m)   Wt (!) 330 lb (149.7 kg)   LMP 09/11/2019 Comment:  PILL  BMI 49.45 kg/m  Second BP 130/80  Body mass index is 49.45 kg/m.  General appearance : Well developed well nourished female. No acute distress HEENT: Eyes: no retinal hemorrhage or exudates,  Neck supple, trachea midline, no carotid bruits, no thyroidmegaly Lungs: Clear to auscultation, no rhonchi or wheezes, or rib retractions  Heart: Regular rate and rhythm, no murmurs or gallops Breast:Examined in sitting and supine position were symmetrical in appearance, no palpable masses or tenderness,  no skin retraction, no nipple inversion, no nipple discharge, no skin discoloration, no axillary or supraclavicular lymphadenopathy Abdomen: no palpable masses or tenderness, no rebound or guarding Extremities: no edema or skin discoloration or tenderness  Pelvic: Vulva: Normal             Vagina: No gross lesions or discharge  Cervix: No gross lesions or discharge.  Pap test/HPV HR.  Gono-Chlam.  Uterus  AV, normal size, shape and consistency, non-tender and mobile  Adnexa  Without masses or tenderness  Anus: Normal   Assessment/Plan:  34 y.o. female for annual exam   1. Encounter for routine gynecological examination with Papanicolaou smear of cervix Normal gynecologic exam.  Pap test/HR HPV done today.  ASCUS/HPV HR pos last year.  If second abnormal pap or HPV HR pos, will f/u for Colposcopy.  Breasts normal.   - Pap IG, CT/NG NAA, and HPV (high risk)  2. Special screening examination for  human papillomavirus (HPV) - Pap IG, CT/NG NAA, and HPV (high risk)  3. Encounter for surveillance of contraceptive pills Well on Blisovi FE 1.5/30.  No contraindication to continue.  Prescription sent to pharmacy.  4. Screening examination for venereal disease - Pap IG, CT/NG NAA, and HPV (high risk)  5. Class 3 severe obesity due to excess calories without serious comorbidity with body mass index (BMI) of 45.0 to 49.9 in adult [facility]) Patient lost 54 pounds since last year on the lower  calorie/carb diet.  Increased her water intake.  Recommend to continue.  Aerobic activities 5 times a week and light weightlifting every 2 days.  Other orders - norethindrone-ethinyl estradiol-iron (BLISOVI FE 1.5/30) 1.5-30 MG-MCG tablet; Take 1 tablet by mouth daily.  Genia Del MD, 4:28 PM 09/23/2019

## 2019-09-26 LAB — PAP IG, CT-NG NAA, HPV HIGH-RISK
C. trachomatis RNA, TMA: NOT DETECTED
HPV DNA High Risk: NOT DETECTED
N. gonorrhoeae RNA, TMA: NOT DETECTED

## 2019-12-03 ENCOUNTER — Other Ambulatory Visit: Payer: Self-pay | Admitting: Obstetrics & Gynecology

## 2020-04-24 ENCOUNTER — Encounter: Payer: Self-pay | Admitting: Family

## 2020-04-24 ENCOUNTER — Telehealth (INDEPENDENT_AMBULATORY_CARE_PROVIDER_SITE_OTHER): Payer: BC Managed Care – PPO | Admitting: Family

## 2020-04-24 DIAGNOSIS — Z7689 Persons encountering health services in other specified circumstances: Secondary | ICD-10-CM

## 2020-04-24 NOTE — Progress Notes (Signed)
Establish care, needs physical

## 2020-04-24 NOTE — Progress Notes (Signed)
Virtual Visit via Telephone Note  I connected with Jasmine Krause, on 04/24/2020 at 4:45 PM by telephone due to the COVID-19 pandemic and verified that I am speaking with the correct person using two identifiers.  Due to current restrictions/limitations of in-office visits due to the COVID-19 pandemic, this scheduled clinical appointment was converted to a telehealth visit.   Consent: I discussed the limitations, risks, security and privacy concerns of performing an evaluation and management service by telephone and the availability of in person appointments. I also discussed with the patient that there may be a patient responsible charge related to this service. The patient expressed understanding and agreed to proceed.   Location of Patient: Home  Location of Provider: [facility]   Persons participating in Telemedicine visit: Seleste L Aurea Graff, NP Eloise Levels, RN   History of Present Illness: Jasmine Krause is to establish care. Patient has a PMH significant for morbid obesity.   Current issues and/or concerns: - None  ROS per HPI    Health Maintenance:  Health Maintenance Due  Topic Date Due   Hepatitis C Screening  Never done      Past Medical History:  Diagnosis Date   Obesity    No Known Allergies  Current Outpatient Medications on File Prior to Visit  Medication Sig Dispense Refill   AUROVELA FE 1.5/30 1.5-30 MG-MCG tablet TAKE 1 TABLET BY MOUTH DAILY 84 tablet 3   No current facility-administered medications on file prior to visit.    Observations/Objective: Alert and oriented x 3. Not in acute distress. Physical examination not completed as this is a telemedicine visit.  Assessment and Plan: 1. Encounter to establish care: - Patient presents today to establish care.  - Return in 4 to 6 weeks or sooner if needed for annual physical examination, labs, and health maintenance. Arrive fasting meaning having had  no food and/or nothing to drink for at least 8 hours prior to appointment.  Follow Up Instructions: Follow-up in 4 to 6 weeks or sooner if needed with primary provider.   Patient was given clear instructions to go to Emergency Department or return to medical center if symptoms don't improve, worsen, or new problems develop.The patient verbalized understanding.  I discussed the assessment and treatment plan with the patient. The patient was provided an opportunity to ask questions and all were answered. The patient agreed with the plan and demonstrated an understanding of the instructions.   The patient was advised to call back or seek an in-person evaluation if the symptoms worsen or if the condition fails to improve as anticipated.   I provided 5 minutes total of non-face-to-face time during this encounter including median intraservice time, reviewing previous notes, labs, imaging, medications, management and patient verbalized understanding.    Rema Fendt, NP  [facility] [place], [place] [phone] 04/26/2020, 9:44 AM

## 2020-05-10 NOTE — Progress Notes (Deleted)
Patient ID: Jasmine Krause, female    DOB: 1985/11/01  MRN: [id number]  CC: Annual Physical Exam  Subjective: Jasmine Krause is a 35 y.o. female who presents for annual physical exam.    Patient Active Problem List   Diagnosis Date Noted  . Morbid obesity (HCC) 05/15/2013     Current Outpatient Medications on File Prior to Visit  Medication Sig Dispense Refill  . AUROVELA FE 1.5/30 1.5-30 MG-MCG tablet TAKE 1 TABLET BY MOUTH DAILY 84 tablet 3   No current facility-administered medications on file prior to visit.    No Known Allergies  Social History   Socioeconomic History  . Marital status: Single    Spouse name: n/a  . Number of children: 0  . Years of education: Associates  . Highest education level: Not on file  Occupational History  . 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 Smoking status: Current Some Day Smoker  . Smokeless tobacco: Never Used  Vaping Use  . Vaping Use: Some days  Substance and Sexual Activity  . Alcohol use: Yes  . Drug use: No  . Sexual activity: Yes    Partners: Male    Birth control/protection: None, Pill    Comment: intercourse age 21, less than 5 sexual pafrtners, CURRENT PARTNER- 9 YRS  Other Topics Concern  . Not on file  Social History Narrative   Lives with her boyfriend and their daughter.   Social Determinants of Health   Financial Resource Strain: Not on file  Food Insecurity: Not on file  Transportation Needs: Not on file  Physical Activity: Not on file  Stress: Not on file  Social Connections: Not on file  Intimate Partner Violence: Not on file    Family History  Problem Relation Age of Onset  . Diabetes Mother   . Hypertension Mother   . Diabetes Maternal Grandmother   . Other Neg Hx     No past surgical history on file.  ROS: Review of Systems Negative except as stated above  PHYSICAL EXAM: There were no vitals taken for this visit.  Physical Exam  {female  adult master:310786} {female adult master:310785}  CMP Latest Ref Rng & Units 10/02/2011  Glucose 70 - 99 mg/dL 88  BUN 6 - 23 mg/dL 5(L)  Creatinine 4.40 - 1.10 mg/dL 3.47  Sodium 425 - 956 mEq/L 136  Potassium 3.5 - 5.1 mEq/L 3.7  Chloride 96 - 112 mEq/L 102  CO2 19 - 32 mEq/L 24  Calcium 8.4 - 10.5 mg/dL 8.6  Total Protein 6.0 - 8.3 g/dL 6.3  Total Bilirubin 0.3 - 1.2 mg/dL 3.8(V)  Alkaline Phos 39 - 117 U/L 70  AST 0 - 37 U/L 17  ALT 0 - 35 U/L 21   Lipid Panel  No results found for: CHOL, TRIG, HDL, CHOLHDL, VLDL, LDLCALC, LDLDIRECT  CBC    Component Value Date/Time   WBC 18.6 (H) 02/28/2012 0515   RBC 3.97 02/28/2012 0515   HGB 10.0 (L) 02/28/2012 0515   HCT 30.8 (L) 02/28/2012 0515   PLT 292 02/28/2012 0515   MCV 77.6 (L) 02/28/2012 0515   MCH 25.2 (L) 02/28/2012 0515   MCHC 32.5 02/28/2012 0515   RDW 15.9 (H) 02/28/2012 0515    ASSESSMENT AND PLAN:  There are no diagnoses linked to this encounter.   Patient was given the opportunity to ask questions.  Patient verbalized understanding of the plan and was able to repeat  key elements of the plan.   No orders of the defined types were placed in this encounter.    Requested Prescriptions    No prescriptions requested or ordered in this encounter    No follow-ups on file.  Rema Fendt, NP

## 2020-05-11 ENCOUNTER — Other Ambulatory Visit: Payer: Self-pay

## 2020-05-11 ENCOUNTER — Encounter: Payer: No Typology Code available for payment source | Admitting: Family

## 2020-05-11 DIAGNOSIS — Z130 Encounter for screening for diseases of the blood and blood-forming organs and certain disorders involving the immune mechanism: Secondary | ICD-10-CM

## 2020-05-11 DIAGNOSIS — Z131 Encounter for screening for diabetes mellitus: Secondary | ICD-10-CM

## 2020-05-11 DIAGNOSIS — Z13220 Encounter for screening for lipoid disorders: Secondary | ICD-10-CM

## 2020-05-11 DIAGNOSIS — Z0000 Encounter for general adult medical examination without abnormal findings: Secondary | ICD-10-CM

## 2020-05-11 DIAGNOSIS — Z1329 Encounter for screening for other suspected endocrine disorder: Secondary | ICD-10-CM

## 2020-05-11 DIAGNOSIS — Z13228 Encounter for screening for other metabolic disorders: Secondary | ICD-10-CM

## 2020-05-11 DIAGNOSIS — Z1159 Encounter for screening for other viral diseases: Secondary | ICD-10-CM

## 2020-06-17 ENCOUNTER — Ambulatory Visit (INDEPENDENT_AMBULATORY_CARE_PROVIDER_SITE_OTHER): Payer: No Typology Code available for payment source | Admitting: Family

## 2020-06-17 ENCOUNTER — Encounter: Payer: Self-pay | Admitting: Family

## 2020-06-17 ENCOUNTER — Other Ambulatory Visit: Payer: Self-pay

## 2020-06-17 VITALS — BP 168/97 | HR 94 | Ht 68.62 in | Wt 327.2 lb

## 2020-06-17 DIAGNOSIS — Z131 Encounter for screening for diabetes mellitus: Secondary | ICD-10-CM

## 2020-06-17 DIAGNOSIS — Z13220 Encounter for screening for lipoid disorders: Secondary | ICD-10-CM

## 2020-06-17 DIAGNOSIS — Z0000 Encounter for general adult medical examination without abnormal findings: Secondary | ICD-10-CM | POA: Diagnosis not present

## 2020-06-17 DIAGNOSIS — R030 Elevated blood-pressure reading, without diagnosis of hypertension: Secondary | ICD-10-CM

## 2020-06-17 DIAGNOSIS — R7303 Prediabetes: Secondary | ICD-10-CM

## 2020-06-17 DIAGNOSIS — Z130 Encounter for screening for diseases of the blood and blood-forming organs and certain disorders involving the immune mechanism: Secondary | ICD-10-CM | POA: Diagnosis not present

## 2020-06-17 DIAGNOSIS — G8929 Other chronic pain: Secondary | ICD-10-CM

## 2020-06-17 DIAGNOSIS — E785 Hyperlipidemia, unspecified: Secondary | ICD-10-CM

## 2020-06-17 DIAGNOSIS — Z1159 Encounter for screening for other viral diseases: Secondary | ICD-10-CM

## 2020-06-17 DIAGNOSIS — Z13228 Encounter for screening for other metabolic disorders: Secondary | ICD-10-CM

## 2020-06-17 DIAGNOSIS — Z1329 Encounter for screening for other suspected endocrine disorder: Secondary | ICD-10-CM

## 2020-06-17 DIAGNOSIS — M545 Low back pain, unspecified: Secondary | ICD-10-CM

## 2020-06-17 NOTE — Progress Notes (Unsigned)
Annual exam Lower back pain  Pt denies any urinary issues

## 2020-06-17 NOTE — Progress Notes (Signed)
Patient ID: NESA DISTEL, female    DOB: October 30, 1985  MRN: [id number]  CC: Annual Physical Exam  Subjective: Jasmine Krause is a 35 y.o. female who presents for annual physical exam.  Her concerns today include:  1. BACK PAIN: Location: bilateral lower back Onset: years  Description: 7/10 comes and goes  Radiation: hips Worse with: standing for long periods of time Trauma: no Popping/clicking sounds with movement/bending: no  Muscle spasms: no  Comments: does not prefer to take over-the-counter or prescribed medication    Patient Active Problem List   Diagnosis Date Noted  . Prediabetes 06/18/2020  . Hyperlipidemia 06/18/2020  . Morbid obesity (HCC) 05/15/2013     Current Outpatient Medications on File Prior to Visit  Medication Sig Dispense Refill  . AUROVELA FE 1.5/30 1.5-30 MG-MCG tablet TAKE 1 TABLET BY MOUTH DAILY 84 tablet 3   No current facility-administered medications on file prior to visit.    No Known Allergies  Social History   Socioeconomic History  . Marital status: Single    Spouse name: n/a  . Number of children: 0  . Years of education: Associates  . Highest education level: Not on file  Occupational History  . 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 Smoking status: Current Some Day Smoker  . Smokeless tobacco: Never Used  Vaping Use  . Vaping Use: Some days  Substance and Sexual Activity  . Alcohol use: Yes  . Drug use: No  . Sexual activity: Yes    Partners: Male    Birth control/protection: None, Pill    Comment: intercourse age 67, less than 5 sexual pafrtners, CURRENT PARTNER- 9 YRS  Other Topics Concern  . Not on file  Social History Narrative   Lives with her boyfriend and their daughter.   Social Determinants of Health   Financial Resource Strain: Not on file  Food Insecurity: Not on file  Transportation Needs: Not on file  Physical Activity: Not on file  Stress: Not on file  Social  Connections: Not on file  Intimate Partner Violence: Not on file    Family History  Problem Relation Age of Onset  . Diabetes Mother   . Hypertension Mother   . Diabetes Maternal Grandmother   . Other Neg Hx     History reviewed. No pertinent surgical history.  ROS: Review of Systems Negative except as stated above  PHYSICAL EXAM: BP (!) 168/97 (BP Location: Left Arm, Patient Position: Sitting)   Pulse 94   Ht 5' 8.62" (1.743 m)   Wt (!) 327 lb 3.2 oz (148.4 kg)   SpO2 99%   BMI 48.85 kg/m    Wt Readings from Last 3 Encounters:  06/17/20 (!) 327 lb 3.2 oz (148.4 kg)  09/23/19 (!) 330 lb (149.7 kg)  08/27/18 (!) 384 lb (174.2 kg)   Physical Exam Constitutional:      Appearance: She is obese.  HENT:     Head: Normocephalic and atraumatic.     Right Ear: Tympanic membrane, ear canal and external ear normal.     Left Ear: Tympanic membrane, ear canal and external ear normal.     Nose: Nose normal.     Mouth/Throat:     Mouth: Mucous membranes are moist.     Pharynx: Oropharynx is clear.  Eyes:     Extraocular Movements: Extraocular movements intact.     Conjunctiva/sclera: Conjunctivae normal.     Pupils: Pupils are equal,  round, and reactive to light.  Cardiovascular:     Rate and Rhythm: Normal rate and regular rhythm.     Pulses: Normal pulses.     Heart sounds: Normal heart sounds. No friction rub.  Pulmonary:     Effort: Pulmonary effort is normal.     Breath sounds: Normal breath sounds.  Chest:  Breasts:     Right: Normal.     Left: Normal.      Comments: Margorie John, CMA present during examination. Abdominal:     General: Bowel sounds are normal.     Palpations: Abdomen is soft.  Genitourinary:    Comments: Patient declined examination. Musculoskeletal:        General: Tenderness present.     Cervical back: Normal range of motion and neck supple.     Lumbar back: Tenderness present.     Comments: Bilateral lumbar tenderness.  Skin:     General: Skin is warm and dry.     Capillary Refill: Capillary refill takes less than 2 seconds.  Neurological:     General: No focal deficit present.     Mental Status: She is alert and oriented to person, place, and time.  Psychiatric:        Mood and Affect: Mood normal.        Behavior: Behavior normal.    ASSESSMENT AND PLAN: 1. Annual physical exam: - Counseled on 150 minutes of exercise per week as tolerated, healthy eating (including decreased daily intake of saturated fats, cholesterol, added sugars, sodium), STI prevention, and routine healthcare maintenance.  2. Screening for metabolic disorder: - CMP to check kidney function, liver function, and electrolyte balance.  - Comprehensive metabolic panel  3. Screening for deficiency anemia: - CBC to screen for anemia. - CBC  4. Diabetes mellitus screening: - Hemoglobin A1c to screen for pre-diabetes/diabetes. - Hemoglobin A1c  5. Screening cholesterol level: - Lipid panel to screen for high cholesterol.  - Lipid Panel  6. Thyroid disorder screen: - TSH to check thyroid function.  - TSH+T4F+T3Free  7. Need for hepatitis C screening test: - HCV antibody to screen for hepatitis C.  - HCV Ab w/Rflx to Verification  8. Elevated blood pressure reading in office without diagnosis of hypertension: - Blood pressure not at goal during today's visit. Patient asymptomatic without chest pressure, chest pain, palpitations, shortness of breath, and worst headache of life. - Counseled on blood pressure goal of less than 130/80, low-sodium, DASH diet, medication compliance, 150 minutes of moderate intensity exercise per week as tolerated. Discussed medication compliance, adverse effects. - Follow-up within 4 weeks with primary provider for blood pressure check. Write down your blood pressure readings each day and bring those results along with your home blood pressure monitor to your appointment.    9. Chronic bilateral low back  pain, unspecified whether sciatica present: - Patient does not prefer to take over-the-counter or prescribed medications.  - Follow-up with primary provider as needed.   Patient was given the opportunity to ask questions.  Patient verbalized understanding of the plan and was able to repeat key elements of the plan. Patient was given clear instructions to go to Emergency Department or return to medical center if symptoms don't improve, worsen, or new problems develop.The patient verbalized understanding.   Orders Placed This Encounter  Procedures  . Comprehensive metabolic panel  . CBC  . Hemoglobin A1c  . TSH+T4F+T3Free  . Lipid Panel  . HCV Ab w/Rflx to Verification  . Interpretation:  Requested Prescriptions   Signed Prescriptions Disp Refills  . metFORMIN (GLUCOPHAGE) 500 MG tablet 30 tablet 0    Sig: Take 1 tablet (500 mg total) by mouth daily with breakfast.  . atorvastatin (LIPITOR) 20 MG tablet 90 tablet 0    Sig: Take 1 tablet (20 mg total) by mouth daily.    Rema Fendt, NP

## 2020-06-17 NOTE — Patient Instructions (Addendum)
Annual physical exam and labs today. Will call with results.   Monitor blood pressure at home. Write down the date and blood pressure reading. Blood pressure goal 120/80 or less. Follow-up with primary provider in 4 weeks.   Follow-up as needed for back pain.   Preventive Care 14-33 Years Old, Female Preventive care refers to lifestyle choices and visits with your health care provider that can promote health and wellness. This includes:  A yearly physical exam. This is also called an annual wellness visit.  Regular dental and eye exams.  Immunizations.  Screening for certain conditions.  Healthy lifestyle choices, such as: ? Eating a healthy diet. ? Getting regular exercise. ? Not using drugs or products that contain nicotine and tobacco. ? Limiting alcohol use. What can I expect for my preventive care visit? Physical exam Your health care provider may check your:  Height and weight. These may be used to calculate your BMI (body mass index). BMI is a measurement that tells if you are at a healthy weight.  Heart rate and blood pressure.  Body temperature.  Skin for abnormal spots. Counseling Your health care provider may ask you questions about your:  Past medical problems.  Family's medical history.  Alcohol, tobacco, and drug use.  Emotional well-being.  Home life and relationship well-being.  Sexual activity.  Diet, exercise, and sleep habits.  Work and work Statistician.  Access to firearms.  Method of birth control.  Menstrual cycle.  Pregnancy history. What immunizations do I need? Vaccines are usually given at various ages, according to a schedule. Your health care provider will recommend vaccines for you based on your age, medical history, and lifestyle or other factors, such as travel or where you work.   What tests do I need? Blood tests  Lipid and cholesterol levels. These may be checked every 5 years starting at age 41.  Hepatitis C  test.  Hepatitis B test. Screening  Diabetes screening. This is done by checking your blood sugar (glucose) after you have not eaten for a while (fasting).  STD (sexually transmitted disease) testing, if you are at risk.  BRCA-related cancer screening. This may be done if you have a family history of breast, ovarian, tubal, or peritoneal cancers.  Pelvic exam and Pap test. This may be done every 3 years starting at age 25. Starting at age 23, this may be done every 5 years if you have a Pap test in combination with an HPV test. Talk with your health care provider about your test results, treatment options, and if necessary, the need for more tests.   Follow these instructions at home: Eating and drinking  Eat a healthy diet that includes fresh fruits and vegetables, whole grains, lean protein, and low-fat dairy products.  Take vitamin and mineral supplements as recommended by your health care provider.  Do not drink alcohol if: ? Your health care provider tells you not to drink. ? You are pregnant, may be pregnant, or are planning to become pregnant.  If you drink alcohol: ? Limit how much you have to 0-1 drink a day. ? Be aware of how much alcohol is in your drink. In the [place] one drink equals one 12 oz bottle of beer (355 mL), one 5 oz glass of wine (148 mL), or one 1 oz glass of hard liquor (44 mL).   Lifestyle  Take daily care of your teeth and gums. Brush your teeth every morning and night with fluoride toothpaste. Floss one  time each day.  Stay active. Exercise for at least 30 minutes 5 or more days each week.  Do not use any products that contain nicotine or tobacco, such as cigarettes, e-cigarettes, and chewing tobacco. If you need help quitting, ask your health care provider.  Do not use drugs.  If you are sexually active, practice safe sex. Use a condom or other form of protection to prevent STIs (sexually transmitted infections).  If you do not wish to become  pregnant, use a form of birth control. If you plan to become pregnant, see your health care provider for a prepregnancy visit.  Find healthy ways to cope with stress, such as: ? Meditation, yoga, or listening to music. ? Journaling. ? Talking to a trusted person. ? Spending time with friends and family. Safety  Always wear your seat belt while driving or riding in a vehicle.  Do not drive: ? If you have been drinking alcohol. Do not ride with someone who has been drinking. ? When you are tired or distracted. ? While texting.  Wear a helmet and other protective equipment during sports activities.  If you have firearms in your house, make sure you follow all gun safety procedures.  Seek help if you have been physically or sexually abused. What's next?  Go to your health care provider once a year for an annual wellness visit.  Ask your health care provider how often you should have your eyes and teeth checked.  Stay up to date on all vaccines. This information is not intended to replace advice given to you by your health care provider. Make sure you discuss any questions you have with your health care provider. Document Revised: 12/01/2019 Document Reviewed: 12/14/2017 Elsevier Patient Education  2021 [employer].

## 2020-06-18 DIAGNOSIS — E785 Hyperlipidemia, unspecified: Secondary | ICD-10-CM | POA: Insufficient documentation

## 2020-06-18 DIAGNOSIS — R7303 Prediabetes: Secondary | ICD-10-CM | POA: Insufficient documentation

## 2020-06-18 LAB — LIPID PANEL
Chol/HDL Ratio: 3.5 ratio (ref 0.0–4.4)
Cholesterol, Total: 272 mg/dL — ABNORMAL HIGH (ref 100–199)
HDL: 78 mg/dL (ref >39)
LDL Chol Calc (NIH): 181 mg/dL — ABNORMAL HIGH (ref 0–99)
Triglycerides: 81 mg/dL (ref 0–149)
VLDL Cholesterol Cal: 13 mg/dL (ref 5–40)

## 2020-06-18 LAB — CBC
Hematocrit: 39 % (ref 34.0–46.6)
Hemoglobin: 12.4 g/dL (ref 11.1–15.9)
MCH: 26.4 pg — ABNORMAL LOW (ref 26.6–33.0)
MCHC: 31.8 g/dL (ref 31.5–35.7)
MCV: 83 fL (ref 79–97)
Platelets: 385 10*3/uL (ref 150–450)
RBC: 4.69 x10E6/uL (ref 3.77–5.28)
RDW: 13.6 % (ref 11.7–15.4)
WBC: 10.7 10*3/uL (ref 3.4–10.8)

## 2020-06-18 LAB — HEMOGLOBIN A1C
Est. average glucose Bld gHb Est-mCnc: 137 mg/dL
Hgb A1c MFr Bld: 6.4 % — ABNORMAL HIGH (ref 4.8–5.6)

## 2020-06-18 LAB — TSH+T4F+T3FREE
Free T4: 1.29 ng/dL (ref 0.82–1.77)
T3, Free: 3.7 pg/mL (ref 2.0–4.4)
TSH: 0.973 u[IU]/mL (ref 0.450–4.500)

## 2020-06-18 LAB — COMPREHENSIVE METABOLIC PANEL
ALT: 17 IU/L (ref 0–32)
AST: 16 IU/L (ref 0–40)
Albumin/Globulin Ratio: 1.6 (ref 1.2–2.2)
Albumin: 4.4 g/dL (ref 3.8–4.8)
Alkaline Phosphatase: 77 IU/L (ref 44–121)
BUN/Creatinine Ratio: 14 (ref 9–23)
BUN: 8 mg/dL (ref 6–20)
Bilirubin Total: <0.2 mg/dL (ref 0.0–1.2)
CO2: 20 mmol/L (ref 20–29)
Calcium: 9.5 mg/dL (ref 8.7–10.2)
Chloride: 103 mmol/L (ref 96–106)
Creatinine, Ser: 0.57 mg/dL (ref 0.57–1.00)
Globulin, Total: 2.7 g/dL (ref 1.5–4.5)
Glucose: 137 mg/dL — ABNORMAL HIGH (ref 65–99)
Potassium: 4.5 mmol/L (ref 3.5–5.2)
Sodium: 138 mmol/L (ref 134–144)
Total Protein: 7.1 g/dL (ref 6.0–8.5)
eGFR: 124 mL/min/{1.73_m2} (ref >59)

## 2020-06-18 LAB — HCV AB W/RFLX TO VERIFICATION: HCV Ab: 0.1 s/co ratio (ref 0.0–0.9)

## 2020-06-18 LAB — HCV INTERPRETATION

## 2020-06-18 MED ORDER — METFORMIN HCL 500 MG PO TABS: 500.0000 mg | ORAL_TABLET | Freq: Every day | ORAL | 0 refills | Status: DC

## 2020-06-18 MED ORDER — ATORVASTATIN CALCIUM 20 MG PO TABS: 20.0000 mg | ORAL_TABLET | Freq: Every day | ORAL | 0 refills | Status: DC

## 2020-06-18 NOTE — Progress Notes (Signed)
Also, please schedule appointment for 4 weeks checkup for pre-diabetes.

## 2020-06-18 NOTE — Progress Notes (Signed)
Please call patient with update.   Kidney function normal.   Liver function normal.   No anemia.   Thyroid normal.  Hepatitis C negative.   Your hemoglobin A1c is consistent with diabetes. Practice healthy eating habits of fresh fruit and vegetables, lean baked meats such as chicken, fish, and [place]; limit breads, rice, pastas, and desserts; practice regular aerobic exercise (at least 150 minutes a week as tolerated) and will recheck at next visit.   Metformin 500 mg once daily prescribed for pre-diabetes. The most common side effects of Metformin are diarrhea, nausea, gas, indigestion, vomiting, and abdominal pain. If you should experience any of these symptoms or otherwise please discontinue use and notify provider. We will recheck you A1c in 3 months. Please schedule appointment while patient is on the phone.   Cholesterol higher than expected. High cholesterol may increase risk of heart attack and/or stroke. Consider eating more fruits, vegetables, and lean baked meats such as chicken or fish. Moderate intensity exercise at least 150 minutes as tolerated per week may help as well.   Atorvastatin 20 mg daily prescribed for high cholesterol. We will recheck in 3 months.

## 2020-07-21 ENCOUNTER — Other Ambulatory Visit: Payer: Self-pay | Admitting: Family

## 2020-07-21 DIAGNOSIS — R7303 Prediabetes: Secondary | ICD-10-CM

## 2020-08-18 ENCOUNTER — Other Ambulatory Visit: Payer: Self-pay | Admitting: Family

## 2020-08-18 DIAGNOSIS — R7303 Prediabetes: Secondary | ICD-10-CM

## 2020-09-23 ENCOUNTER — Other Ambulatory Visit: Payer: Self-pay

## 2020-09-23 ENCOUNTER — Ambulatory Visit (INDEPENDENT_AMBULATORY_CARE_PROVIDER_SITE_OTHER): Payer: BC Managed Care – PPO | Admitting: Obstetrics & Gynecology

## 2020-09-23 ENCOUNTER — Encounter: Payer: Self-pay | Admitting: Obstetrics & Gynecology

## 2020-09-23 ENCOUNTER — Other Ambulatory Visit (HOSPITAL_COMMUNITY)
Admission: RE | Admit: 2020-09-23 | Discharge: 2020-09-23 | Disposition: A | Discharge status: Home / Self Care | Payer: BC Managed Care – PPO | Source: Ambulatory Visit | Attending: Obstetrics & Gynecology | Admitting: Obstetrics & Gynecology

## 2020-09-23 VITALS — BP 132/86 | Ht 68.0 in | Wt 331.0 lb

## 2020-09-23 DIAGNOSIS — Z01419 Encounter for gynecological examination (general) (routine) without abnormal findings: Secondary | ICD-10-CM | POA: Insufficient documentation

## 2020-09-23 DIAGNOSIS — Z3041 Encounter for surveillance of contraceptive pills: Secondary | ICD-10-CM | POA: Diagnosis not present

## 2020-09-23 DIAGNOSIS — Z1151 Encounter for screening for human papillomavirus (HPV): Secondary | ICD-10-CM

## 2020-09-23 DIAGNOSIS — Z113 Encounter for screening for infections with a predominantly sexual mode of transmission: Secondary | ICD-10-CM | POA: Diagnosis not present

## 2020-09-23 MED ORDER — NORETHIN ACE-ETH ESTRAD-FE 1.5-30 MG-MCG PO TABS: 1.0000 | ORAL_TABLET | Freq: Every day | ORAL | 5 refills | Status: DC

## 2020-09-23 NOTE — Progress Notes (Signed)
Jasmine Krause 09-07-1985 [id number]   History:    35 y.o. G1P1L1 stable partner/FOB. Daughter 23 yo.  XA:[id number] presenting for annual gyn exam   EZM:OQHU on BCPs with Blisovi, but having dysmenorrhea. No BTB. No pelvic pain. No pain with IC. Urine/BMs normal. Breasts normal. BMI 50.33, just slightly increased x last year.  Continues with decreased overall calories and drinks more water.    Past medical history,surgical history, family history and social history were all reviewed and documented in the EPIC chart.  Gynecologic History Patient's last menstrual period was 09/16/2020.  Obstetric History OB History  Gravida Para Term Preterm AB Living  1 1 1  0 0 1  SAB IAB Ectopic Multiple Live Births  0 0 0 0 1    # Outcome Date GA Lbr Len/2nd Weight Sex Delivery Anes PTL Lv  1 Term 02/28/12 [redacted]w[redacted]d / 00:41 6 lb 14.1 oz (3.121 kg) F Vag-Spont EPI  LIV     ROS: A ROS was performed and pertinent positives and negatives are included in the history.  GENERAL: No fevers or chills. HEENT: No change in vision, no earache, sore throat or sinus congestion. NECK: No pain or stiffness. CARDIOVASCULAR: No chest pain or pressure. No palpitations. PULMONARY: No shortness of breath, cough or wheeze. GASTROINTESTINAL: No abdominal pain, nausea, vomiting or diarrhea, melena or bright red blood per rectum. GENITOURINARY: No urinary frequency, urgency, hesitancy or dysuria. MUSCULOSKELETAL: No joint or muscle pain, no back pain, no recent trauma. DERMATOLOGIC: No rash, no itching, no lesions. ENDOCRINE: No polyuria, polydipsia, no heat or cold intolerance. No recent change in weight. HEMATOLOGICAL: No anemia or easy bruising or bleeding. NEUROLOGIC: No headache, seizures, numbness, tingling or weakness. PSYCHIATRIC: No depression, no loss of interest in normal activity or change in sleep pattern.     Exam:   BP 132/86   Ht 5\' 8"  (1.727 m)   Wt (!) 331 lb (150.1 kg)   LMP  09/16/2020   BMI 50.33 kg/m   Body mass index is 50.33 kg/m.  General appearance : Well developed well nourished female. No acute distress HEENT: Eyes: no retinal hemorrhage or exudates,  Neck supple, trachea midline, no carotid bruits, no thyroidmegaly Lungs: Clear to auscultation, no rhonchi or wheezes, or rib retractions  Heart: Regular rate and rhythm, no murmurs or gallops Breast:Examined in sitting and supine position were symmetrical in appearance, no palpable masses or tenderness,  no skin retraction, no nipple inversion, no nipple discharge, no skin discoloration, no axillary or supraclavicular lymphadenopathy Abdomen: no palpable masses or tenderness, no rebound or guarding Extremities: no edema or skin discoloration or tenderness  Pelvic: Vulva: Normal             Vagina: No gross lesions or discharge  Cervix: No gross lesions or discharge.  Pap/HPV HR, Gono-Chlam done.  Uterus  RV, normal size, shape and consistency, non-tender and mobile  Adnexa  Without masses or tenderness  Anus: Normal   Assessment/Plan:  35 y.o. female for annual exam   1. Encounter for routine gynecological examination with Papanicolaou smear of cervix Normal Gynecologic Exam.  Pap test with high-risk HPV done.  Breast exam normal.  Health labs with family nurse practitioner. - Cytology - PAP( [place])  2. Special screening examination for human papillomavirus (HPV)  3. Encounter for surveillance of contraceptive pills Well on Blisovi.  No CI to continue.  Dysmenorrhea.  Continuous use counseling done and recommended.  Prescription sent to pharmacy.  4. Screening  examination for venereal disease HPV HR, Gono-Chlam done on Pap.  5. Obesity, morbid, BMI 50 or higher (HCC) Continue with low calorie/carb diet.  Informed about intermittent fasting.  Recommend intermittent fasting for 16 to 18 hours 5 days a week.  Good hydration with water especially during the fasting times.  Aerobic activities  5 times a week with light weightlifting every 2 days recommended.  Other orders - norethindrone-ethinyl estradiol-iron (AUROVELA FE 1.5/30) 1.5-30 MG-MCG tablet; Take 1 tablet by mouth daily. Continuous use for Dysmenorrhea  Genia Del MD, 9:38 AM 09/23/2020

## 2020-09-24 LAB — CYTOLOGY - PAP
Chlamydia: NEGATIVE
Comment: NEGATIVE
Comment: NEGATIVE
Comment: NORMAL
Diagnosis: NEGATIVE
High risk HPV: POSITIVE — AB
Neisseria Gonorrhea: NEGATIVE

## 2020-12-01 ENCOUNTER — Ambulatory Visit: Payer: BC Managed Care – PPO | Admitting: Obstetrics and Gynecology

## 2020-12-01 ENCOUNTER — Telehealth: Payer: Self-pay

## 2020-12-01 NOTE — Telephone Encounter (Signed)
Jasmine Krause had scheduled patient's colposcopy appt with Dr. Edward Jolly because she had sooner appt but was made aware that since she was Dr. Mackey Birchwood patient she would need to see Dr. Mackey Birchwood. She called her to r/s the appt and I received a message that patient wanted to speak with me.  I called her and left message to call. Appt has not yet been scheduled.

## 2020-12-04 NOTE — Telephone Encounter (Signed)
Message sent to [facility]- appointment pool to contact pt to schedule with ML. Patient aware of change

## 2020-12-07 ENCOUNTER — Other Ambulatory Visit: Payer: Self-pay | Admitting: *Deleted

## 2020-12-07 DIAGNOSIS — R87810 Cervical high risk human papillomavirus (HPV) DNA test positive: Secondary | ICD-10-CM

## 2020-12-31 ENCOUNTER — Telehealth: Payer: Self-pay | Admitting: Family

## 2020-12-31 NOTE — Telephone Encounter (Signed)
Called pt to make appt no answer LVM

## 2020-12-31 NOTE — Telephone Encounter (Signed)
Pt called in about trying to get a letter for her complex stating her dog is emotional support so she dont have to get rid of her dog and wants to speak to the provider pt last seen 06-17-20 did express to the pt it can take up to 48 hr for a response

## 2021-01-08 ENCOUNTER — Other Ambulatory Visit (HOSPITAL_COMMUNITY)
Admission: RE | Admit: 2021-01-08 | Discharge: 2021-01-08 | Disposition: A | Discharge status: Home / Self Care | Payer: BC Managed Care – PPO | Source: Ambulatory Visit | Attending: Obstetrics & Gynecology | Admitting: Obstetrics & Gynecology

## 2021-01-08 ENCOUNTER — Ambulatory Visit: Payer: BC Managed Care – PPO | Admitting: Obstetrics & Gynecology

## 2021-01-08 ENCOUNTER — Other Ambulatory Visit: Payer: Self-pay

## 2021-01-08 ENCOUNTER — Encounter: Payer: Self-pay | Admitting: Obstetrics & Gynecology

## 2021-01-08 DIAGNOSIS — N870 Mild cervical dysplasia: Secondary | ICD-10-CM

## 2021-01-08 DIAGNOSIS — R87810 Cervical high risk human papillomavirus (HPV) DNA test positive: Secondary | ICD-10-CM | POA: Insufficient documentation

## 2021-01-08 NOTE — Progress Notes (Signed)
    Jasmine Krause Oct 20, 1985 [id number]        35 y.o.  G1P1001   RP: HR HPV Positive for Colposcopy  HPI: Pap 09/2020 with HR HPV Positive, Pap was Neg.  HPV HR was positive as well in 2020.  HPV 16-18-45 not done.   OB History  Gravida Para Term Preterm AB Living  1 1 1  0 0 1  SAB IAB Ectopic Multiple Live Births  0 0 0 0 1    # Outcome Date GA Lbr Len/2nd Weight Sex Delivery Anes PTL Lv  1 Term 02/28/12 [redacted]w[redacted]d / 00:41 6 lb 14.1 oz (3.121 kg) F Vag-Spont EPI  LIV    Past medical history,surgical history, problem list, medications, allergies, family history and social history were all reviewed and documented in the EPIC chart.   Directed ROS with pertinent positives and negatives documented in the history of present illness/assessment and plan.  Exam:  There were no vitals filed for this visit. General appearance:  Normal  Colposcopy Procedure Note Jasmine Krause 01/08/2021  Indications:  HR HPV Positive  Procedure Details  The risks and benefits of the procedure and Written informed consent obtained.  Speculum placed in vagina and excellent visualization of cervix achieved, cervix swabbed x 3 with acetic acid solution.  Findings:  Cervix colposcopy: Physical Exam Genitourinary:       Vaginal colposcopy: Normal  Vulvar colposcopy: Normal  Perirectal colposcopy: Normal  The cervix was sprayed with Hurricane before performing the cervical biopsies.  Specimens: HPV 16-18-45.  Cervical Bx at 6 and 9 O'Clock.  Complications:  No complication, good hemostasis with Silver Nitrate. . Plan: Management per results.   Assessment/Plan:  35 y.o. G1P1001   1. Cervical high risk HPV (human papillomavirus) test positive Counseling on high risk HPV.  Colposcopy procedure explained and questions answered.  Colposcopy findings reviewed with patient.  Postprocedure precautions reviewed.  Management per results. - Colposcopy - Cytology - PAP( Jasmine Krause) - Surgical  pathology( Jasmine Krause/ Jasmine Krause)   32 MD, 12:38 PM 01/08/2021

## 2021-01-12 LAB — SURGICAL PATHOLOGY

## 2021-01-13 ENCOUNTER — Encounter: Payer: Self-pay | Admitting: Obstetrics & Gynecology

## 2021-01-17 ENCOUNTER — Other Ambulatory Visit: Payer: Self-pay

## 2021-01-17 ENCOUNTER — Ambulatory Visit
Admission: EM | Admit: 2021-01-17 | Discharge: 2021-01-17 | Disposition: A | Discharge status: Home / Self Care | Payer: BC Managed Care – PPO | Attending: Urgent Care | Admitting: Urgent Care

## 2021-01-17 DIAGNOSIS — L03012 Cellulitis of left finger: Secondary | ICD-10-CM | POA: Diagnosis not present

## 2021-01-17 MED ORDER — NAPROXEN 500 MG PO TABS: 500.0000 mg | ORAL_TABLET | Freq: Two times a day (BID) | ORAL | 0 refills | Status: DC

## 2021-01-17 MED ORDER — CEPHALEXIN 500 MG PO CAPS: 500.0000 mg | ORAL_CAPSULE | Freq: Three times a day (TID) | ORAL | 0 refills | Status: DC

## 2021-01-17 NOTE — ED Triage Notes (Signed)
One week h/o left 4th digit pain and swelling. Has been using OTC antibiotic ointment and peroxide soaks with some relief. No meds taken.

## 2021-01-17 NOTE — ED Provider Notes (Signed)
[facility]   MRN: [id number] DOB: 1986-03-05  Subjective:   Jasmine Krause is a 35 y.o. female presenting for 1 week history of persistent right finger nail pain with signs of pus.  Denies fever, nausea, vomiting, red streaking along her hand.  No current facility-administered medications for this encounter.  Current Outpatient Medications:    atorvastatin (LIPITOR) 20 MG tablet, Take 1 tablet (20 mg total) by mouth daily. (Patient not taking: Reported on 01/08/2021), Disp: 90 tablet, Rfl: 0   metFORMIN (GLUCOPHAGE) 500 MG tablet, TAKE 1 TABLET(500 MG) BY MOUTH DAILY WITH BREAKFAST (Patient not taking: No sig reported), Disp: 30 tablet, Rfl: 0   norethindrone-ethinyl estradiol-iron (AUROVELA FE 1.5/30) 1.5-30 MG-MCG tablet, Take 1 tablet by mouth daily. Continuous use for Dysmenorrhea, Disp: 84 tablet, Rfl: 5   No Known Allergies  Past Medical History:  Diagnosis Date   Abnormal Pap smear of cervix    09-23-20 neg HPV HR+   High cholesterol    Obesity      History reviewed. No pertinent surgical history.  Family History  Problem Relation Age of Onset   Diabetes Mother    Hypertension Mother    Diabetes Maternal Grandmother    Hypertension Maternal Grandmother    Other Neg Hx     Social History   Tobacco Use   Smoking status: Former   Smokeless tobacco: Never  Building services engineer Use: Former  Substance Use Topics   Alcohol use: Yes    Comment: OCC   Drug use: No    ROS   Objective:   Vitals: BP (!) 161/98 (BP Location: Left Arm)   Pulse 85   Temp 98.1 F (36.7 C) (Oral)   Resp 18   LMP 01/04/2021 (Approximate)   SpO2 98%   Physical Exam Constitutional:      General: She is not in acute distress.    Appearance: Normal appearance. She is well-developed. She is obese. She is not ill-appearing, toxic-appearing or diaphoretic.  HENT:     Head: Normocephalic and atraumatic.     Nose: Nose normal.     Mouth/Throat:     Mouth: Mucous  membranes are moist.     Pharynx: Oropharynx is clear.  Eyes:     General: No scleral icterus.    Extraocular Movements: Extraocular movements intact.     Pupils: Pupils are equal, round, and reactive to light.  Cardiovascular:     Rate and Rhythm: Normal rate.  Pulmonary:     Effort: Pulmonary effort is normal.  Musculoskeletal:       Hands:  Skin:    General: Skin is warm and dry.  Neurological:     General: No focal deficit present.     Mental Status: She is alert and oriented to person, place, and time.  Psychiatric:        Mood and Affect: Mood normal.        Behavior: Behavior normal.    PROCEDURE NOTE: Paronychia I&D Verbal consent obtained. Local anesthesia using a digital block with 3.5cc of bupivacaine %. Site cleansed with Betadine.  Paronychia expressed using Adson forcep, discharge 2cc mixture of purulence, serosanguineous fluid. Cleansed and dressed.   Assessment and Plan :   PDMP not reviewed this encounter.  1. Paronychia of finger of left hand    Successful I&D performed.  Wound care reviewed.  Start Keflex for the abscess, naproxen for pain and inflammation. Counseled patient on potential for adverse effects with medications  prescribed/recommended today, ER and return-to-clinic precautions discussed, patient verbalized understanding.    Wallis Bamberg, PA-C 01/17/21 1315

## 2021-01-18 LAB — CYTOLOGY - PAP: Diagnosis: REACTIVE

## 2021-02-01 ENCOUNTER — Encounter: Payer: Self-pay | Admitting: *Deleted

## 2021-02-04 ENCOUNTER — Other Ambulatory Visit: Payer: Self-pay | Admitting: Family

## 2021-02-04 DIAGNOSIS — E785 Hyperlipidemia, unspecified: Secondary | ICD-10-CM

## 2021-02-04 DIAGNOSIS — R7303 Prediabetes: Secondary | ICD-10-CM

## 2021-02-05 ENCOUNTER — Telehealth: Payer: Self-pay | Admitting: *Deleted

## 2021-02-05 MED ORDER — METFORMIN HCL 500 MG PO TABS: 500.0000 mg | ORAL_TABLET | Freq: Every day | ORAL | 0 refills | Status: DC

## 2021-02-05 MED ORDER — ATORVASTATIN CALCIUM 20 MG PO TABS: 20.0000 mg | ORAL_TABLET | Freq: Every day | ORAL | 0 refills | Status: DC

## 2021-02-05 NOTE — Telephone Encounter (Signed)
Unable to get her in this morning. If available can we see her Monday? If so what time?

## 2021-02-05 NOTE — Telephone Encounter (Signed)
Needs office visit.  Please have her come for an appointment this morning.

## 2021-02-05 NOTE — Telephone Encounter (Signed)
Patient informed,reports the bleeding is not heavy and the bleeding is similar to her regular period flow. Reports she takes birth control pills and she is going try fixing the way she takes her pills before scheduling office. Patient aware if bleeding becomes heavy ex: changing pad or tampon less 30 minutes or every 1 hour. Patient verbalized she understood.

## 2021-02-05 NOTE — Telephone Encounter (Signed)
Pt complains of heavy vaginal bleeding with clots. Bleeding starts around 5 pm every day. No pain, no discharge itching or odor. Patient noticed the bleeding after the C&B September 23 rd. Patient changes pad once a day. I will route to Provider for recommendations.

## 2021-02-05 NOTE — Telephone Encounter (Signed)
I would schedule her on Tuesday with Dr. Seymour Bars.  To urgent care or ER if bleeding heavy and changing a pad every hour during the weekend.

## 2021-08-17 ENCOUNTER — Ambulatory Visit: Admission: EM | Admit: 2021-08-17 | Payer: BC Managed Care – PPO

## 2021-08-17 ENCOUNTER — Encounter (HOSPITAL_COMMUNITY): Payer: Self-pay

## 2021-08-17 ENCOUNTER — Ambulatory Visit (HOSPITAL_COMMUNITY)
Admission: EM | Admit: 2021-08-17 | Discharge: 2021-08-17 | Disposition: A | Discharge status: Home / Self Care | Payer: No Typology Code available for payment source | Attending: Nurse Practitioner | Admitting: Nurse Practitioner

## 2021-08-17 DIAGNOSIS — L02214 Cutaneous abscess of groin: Secondary | ICD-10-CM | POA: Diagnosis not present

## 2021-08-17 MED ORDER — DOXYCYCLINE HYCLATE 100 MG PO CAPS: 100.0000 mg | ORAL_CAPSULE | Freq: Two times a day (BID) | ORAL | 0 refills | Status: AC

## 2021-08-17 NOTE — ED Provider Notes (Signed)
?[facility] ? ? ? ?CSN: [id number] ?Arrival date & time: 08/17/21  1530 ? ? ?  ? ?History   ?Chief Complaint ?Chief Complaint  ?Patient presents with  ? Abscess  ?  groin  ? ? ?HPI ?Jasmine Krause is a 36 y.o. female.  ? ?Patient presents with an abscess to her left groin that has been present for the past few days.  She reports it was extremely painful, swollen, and red over the weekend.  She used warm compresses and was able to get it to drain on its own.  She denies any fevers, nausea/vomiting.  Reports abscess is still draining some, she wants it checked to make sure it is "not infected" ? ?Reports last menstrual period was in the last month; takes oral contraceptives regularly ? ? ?Past Medical History:  ?Diagnosis Date  ? Abnormal Pap smear of cervix   ? 09-23-20 neg HPV HR+  ? High cholesterol   ? Obesity   ? ? ?Patient Active Problem List  ? Diagnosis Date Noted  ? Prediabetes 06/18/2020  ? Hyperlipidemia 06/18/2020  ? Morbid obesity (HCC) 05/15/2013  ? ? ?History reviewed. No pertinent surgical history. ? ?OB History   ? ? Gravida  ?1  ? Para  ?1  ? Term  ?1  ? Preterm  ?0  ? AB  ?0  ? Living  ?1  ?  ? ? SAB  ?0  ? IAB  ?0  ? Ectopic  ?0  ? Multiple  ?0  ? Live Births  ?1  ?   ?  ?  ? ? ? ?Home Medications   ? ?Prior to Admission medications   ?Medication Sig Start Date End Date Taking? Authorizing Provider  ?doxycycline (VIBRAMYCIN) 100 MG capsule Take 1 capsule (100 mg total) by mouth 2 (two) times daily for 7 days. 08/17/21 08/24/21 Yes Valentino Nose, NP  ?atorvastatin (LIPITOR) 20 MG tablet Take 1 tablet (20 mg total) by mouth daily. 02/05/21   Rema Fendt, NP  ?metFORMIN (GLUCOPHAGE) 500 MG tablet Take 1 tablet (500 mg total) by mouth daily with breakfast. 02/05/21   Rema Fendt, NP  ?naproxen (NAPROSYN) 500 MG tablet Take 1 tablet (500 mg total) by mouth 2 (two) times daily with a meal. 01/17/21   Wallis Bamberg, PA-C  ?norethindrone-ethinyl estradiol-iron (AUROVELA FE 1.5/30) 1.5-30  MG-MCG tablet Take 1 tablet by mouth daily. Continuous use for Dysmenorrhea 09/23/20   Genia Del, MD  ? ? ?Family History ?Family History  ?Problem Relation Age of Onset  ? Diabetes Mother   ? Hypertension Mother   ? Diabetes Maternal Grandmother   ? Hypertension Maternal Grandmother   ? Other Neg Hx   ? ? ?Social History ?Social History  ? ?Tobacco Use  ? Smoking status: Former  ? Smokeless tobacco: Never  ?Vaping Use  ? Vaping Use: Former  ?Substance Use Topics  ? Alcohol use: Yes  ?  Comment: OCC  ? Drug use: No  ? ? ? ?Allergies   ?Patient has no known allergies. ? ? ?Review of Systems ?Review of Systems ?Per HPI ? ?Physical Exam ?Triage Vital Signs ?ED Triage Vitals [08/17/21 1633]  ?Enc Vitals Group  ?   BP (!) 155/92  ?   Pulse Rate (!) 113  ?   Resp 18  ?   Temp 98 ?F (36.7 ?C)  ?   Temp Source Oral  ?   SpO2 97 %  ?   Weight   ?  Height   ?   Head Circumference   ?   Peak Flow   ?   Pain Score 0  ?   Pain Loc   ?   Pain Edu?   ?   Excl. in GC?   ? ?No data found. ? ?Updated Vital Signs ?BP (!) 155/92 (BP Location: Left Arm)   Pulse (!) 113   Temp 98 ?F (36.7 ?C) (Oral)   Resp 18   SpO2 97%  ? ?Visual Acuity ?Right Eye Distance:   ?Left Eye Distance:   ?Bilateral Distance:   ? ?Right Eye Near:   ?Left Eye Near:    ?Bilateral Near:    ? ?Physical Exam ?Vitals and nursing note reviewed.  ?Constitutional:   ?   General: She is not in acute distress. ?   Appearance: Normal appearance. She is not toxic-appearing.  ?Pulmonary:  ?   Effort: Pulmonary effort is normal. No respiratory distress.  ?Skin: ?   General: Skin is warm and dry.  ?   Capillary Refill: Capillary refill takes less than 2 seconds.  ?   Coloration: Skin is not jaundiced or pale.  ?   Findings: Abscess present. No erythema.  ? ?    ?   Comments: Currently 4 cm x 2 cm firm abscess to left groin in area marked.  No surrounding erythema.  There is some serosanguinous drainage.  ?Neurological:  ?   Mental Status: She is alert and oriented  to person, place, and time.  ?   Motor: No weakness.  ?   Gait: Gait normal.  ? ? ? ?UC Treatments / Results  ?Labs ?(all labs ordered are listed, but only abnormal results are displayed) ?Labs Reviewed - No data to display ? ?EKG ? ? ?Radiology ?No results found. ? ?Procedures ?Procedures (including critical care time) ? ?Medications Ordered in UC ?Medications - No data to display ? ?Initial Impression / Assessment and Plan / UC Course  ?I have reviewed the triage vital signs and the nursing notes. ? ?Pertinent labs & imaging results that were available during my care of the patient were reviewed by me and considered in my medical decision making (see chart for details). ? ?  ?Treat abscess with doxycycline 100 mg twice daily for 7 days.  Encouraged continued use of warm compresses to help with drainage.  Continue to keep covered until fully heals over, then can leave uncovered.  Seek care if symptoms persist or worsen despite treatment ?Final Clinical Impressions(s) / UC Diagnoses  ? ?Final diagnoses:  ?Abscess of groin, left  ? ? ? ?Discharge Instructions   ? ?  ?- Please continue using warm compresses to help with drainage ?-Start doxycycline 100 mg twice daily for 7 days to help clear up infection ?-You can use Tylenol or ibuprofen to help with pain ?-Care if your symptoms persist or worsen despite treatment ? ? ? ? ?ED Prescriptions   ? ? Medication Sig Dispense Auth. Provider  ? doxycycline (VIBRAMYCIN) 100 MG capsule Take 1 capsule (100 mg total) by mouth 2 (two) times daily for 7 days. 14 capsule Valentino Nose, NP  ? ?  ? ?PDMP not reviewed this encounter. ?  ?Valentino Nose, NP ?08/17/21 1722 ? ?

## 2021-08-17 NOTE — Discharge Instructions (Addendum)
-   Please continue using warm compresses to help with drainage ?-Start doxycycline 100 mg twice daily for 7 days to help clear up infection ?-You can use Tylenol or ibuprofen to help with pain ?-Care if your symptoms persist or worsen despite treatment ?

## 2021-08-17 NOTE — ED Triage Notes (Signed)
4 days ago, Pt popped an abscess on the left side of her groin/thigh area. Pt reports that the area is still draining. Has been covering the area w/a bandaid. No meds taken. ?

## 2021-09-24 ENCOUNTER — Ambulatory Visit: Payer: BC Managed Care – PPO | Admitting: Obstetrics & Gynecology

## 2021-10-10 ENCOUNTER — Other Ambulatory Visit: Payer: Self-pay | Admitting: Obstetrics & Gynecology

## 2021-10-12 ENCOUNTER — Other Ambulatory Visit: Payer: Self-pay | Admitting: Obstetrics & Gynecology

## 2021-10-12 DIAGNOSIS — Z3041 Encounter for surveillance of contraceptive pills: Secondary | ICD-10-CM

## 2021-10-18 ENCOUNTER — Emergency Department (HOSPITAL_COMMUNITY): Payer: No Typology Code available for payment source

## 2021-10-18 ENCOUNTER — Other Ambulatory Visit: Payer: Self-pay

## 2021-10-18 ENCOUNTER — Observation Stay (HOSPITAL_COMMUNITY)
Admission: EM | Admit: 2021-10-18 | Discharge: 2021-10-21 | Disposition: A | Discharge status: Home / Self Care | Payer: No Typology Code available for payment source | Attending: Surgery | Admitting: Surgery

## 2021-10-18 ENCOUNTER — Encounter (HOSPITAL_COMMUNITY): Payer: Self-pay | Admitting: *Deleted

## 2021-10-18 ENCOUNTER — Ambulatory Visit (HOSPITAL_COMMUNITY)
Admission: EM | Admit: 2021-10-18 | Discharge: 2021-10-18 | Disposition: A | Discharge status: Home / Self Care | Payer: No Typology Code available for payment source | Attending: Emergency Medicine | Admitting: Emergency Medicine

## 2021-10-18 ENCOUNTER — Encounter (HOSPITAL_COMMUNITY): Payer: Self-pay

## 2021-10-18 DIAGNOSIS — R1031 Right lower quadrant pain: Secondary | ICD-10-CM | POA: Diagnosis not present

## 2021-10-18 DIAGNOSIS — Z87891 Personal history of nicotine dependence: Secondary | ICD-10-CM | POA: Diagnosis not present

## 2021-10-18 DIAGNOSIS — K819 Cholecystitis, unspecified: Secondary | ICD-10-CM | POA: Diagnosis present

## 2021-10-18 DIAGNOSIS — R509 Fever, unspecified: Secondary | ICD-10-CM

## 2021-10-18 DIAGNOSIS — R7303 Prediabetes: Secondary | ICD-10-CM

## 2021-10-18 DIAGNOSIS — K8012 Calculus of gallbladder with acute and chronic cholecystitis without obstruction: Secondary | ICD-10-CM | POA: Diagnosis not present

## 2021-10-18 DIAGNOSIS — E1165 Type 2 diabetes mellitus with hyperglycemia: Secondary | ICD-10-CM | POA: Insufficient documentation

## 2021-10-18 DIAGNOSIS — E0800 Diabetes mellitus due to underlying condition with hyperosmolarity without nonketotic hyperglycemic-hyperosmolar coma (NKHHC): Secondary | ICD-10-CM

## 2021-10-18 DIAGNOSIS — K3580 Unspecified acute appendicitis: Secondary | ICD-10-CM | POA: Diagnosis not present

## 2021-10-18 DIAGNOSIS — R1011 Right upper quadrant pain: Secondary | ICD-10-CM | POA: Diagnosis present

## 2021-10-18 LAB — URINALYSIS, ROUTINE W REFLEX MICROSCOPIC
Bilirubin Urine: NEGATIVE
Glucose, UA: 150 mg/dL — AB
Ketones, ur: NEGATIVE mg/dL
Leukocytes,Ua: NEGATIVE
Nitrite: NEGATIVE
Protein, ur: 100 mg/dL — AB
Specific Gravity, Urine: 1.024 (ref 1.005–1.030)
pH: 6 (ref 5.0–8.0)

## 2021-10-18 LAB — I-STAT BETA HCG BLOOD, ED (MC, WL, AP ONLY): I-stat hCG, quantitative: <5 m[IU]/mL (ref <5)

## 2021-10-18 LAB — COMPREHENSIVE METABOLIC PANEL
ALT: 24 U/L (ref 0–44)
AST: 19 U/L (ref 15–41)
Albumin: 3.6 g/dL (ref 3.5–5.0)
Alkaline Phosphatase: 80 U/L (ref 38–126)
Anion gap: 11 (ref 5–15)
BUN: 5 mg/dL — ABNORMAL LOW (ref 6–20)
CO2: 21 mmol/L — ABNORMAL LOW (ref 22–32)
Calcium: 9.3 mg/dL (ref 8.9–10.3)
Chloride: 102 mmol/L (ref 98–111)
Creatinine, Ser: 0.79 mg/dL (ref 0.44–1.00)
GFR, Estimated: >60 mL/min (ref >60)
Glucose, Bld: 314 mg/dL — ABNORMAL HIGH (ref 70–99)
Potassium: 3.8 mmol/L (ref 3.5–5.1)
Sodium: 134 mmol/L — ABNORMAL LOW (ref 135–145)
Total Bilirubin: 0.5 mg/dL (ref 0.3–1.2)
Total Protein: 8 g/dL (ref 6.5–8.1)

## 2021-10-18 LAB — POCT URINALYSIS DIPSTICK, ED / UC
Bilirubin Urine: NEGATIVE
Glucose, UA: >=1000 mg/dL — AB
Hgb urine dipstick: NEGATIVE
Ketones, ur: NEGATIVE mg/dL
Leukocytes,Ua: NEGATIVE
Nitrite: NEGATIVE
Protein, ur: NEGATIVE mg/dL
Specific Gravity, Urine: 1.01 (ref 1.005–1.030)
Urobilinogen, UA: 0.2 mg/dL (ref 0.0–1.0)
pH: 6.5 (ref 5.0–8.0)

## 2021-10-18 LAB — CBC
HCT: 43.9 % (ref 36.0–46.0)
Hemoglobin: 13.7 g/dL (ref 12.0–15.0)
MCH: 25.9 pg — ABNORMAL LOW (ref 26.0–34.0)
MCHC: 31.2 g/dL (ref 30.0–36.0)
MCV: 83.1 fL (ref 80.0–100.0)
Platelets: 362 10*3/uL (ref 150–400)
RBC: 5.28 MIL/uL — ABNORMAL HIGH (ref 3.87–5.11)
RDW: 13.9 % (ref 11.5–15.5)
WBC: 21.3 10*3/uL — ABNORMAL HIGH (ref 4.0–10.5)
nRBC: 0 % (ref 0.0–0.2)

## 2021-10-18 LAB — LIPASE, BLOOD: Lipase: 31 U/L (ref 11–51)

## 2021-10-18 MED ORDER — ONDANSETRON 4 MG PO TBDP: 4.0000 mg | ORAL_TABLET | Freq: Once | ORAL | Status: DC | PRN

## 2021-10-18 MED ORDER — PIPERACILLIN-TAZOBACTAM 3.375 G IVPB 30 MIN
3.3750 g | Freq: Once | INTRAVENOUS | Status: AC
Start: 2021-10-18 — End: 2021-10-18
  Administered 2021-10-18: 3.375 g via INTRAVENOUS
  Filled 2021-10-18: qty 50

## 2021-10-18 MED ORDER — IOHEXOL 300 MG/ML  SOLN
100.0000 mL | Freq: Once | INTRAMUSCULAR | Status: AC | PRN
  Administered 2021-10-18: 100 mL via INTRAVENOUS

## 2021-10-18 NOTE — ED Notes (Signed)
Patient is being discharged from the Urgent Care Center and sent to the Emergency Department via private vehicle. Per Quita Skye, PA, patient is stable but in need of higher level of care due to abd pain R/O appendicitis. Patient is aware and verbalizes understanding of plan of care.  Vitals:   10/18/21 1339  BP: 133/88  Pulse: (!) 139  Resp: (!) 22  Temp: 99 F (37.2 C)  SpO2: 97%

## 2021-10-18 NOTE — ED Notes (Signed)
Called pt for triage x4, no response. Will try again in a few minutes.

## 2021-10-18 NOTE — ED Triage Notes (Addendum)
Back pain, 1 episode vomiting 2 days ago. Pain has since migrated to RLQ. C/O pain worsening with ambulation, breathing, laughing. No known fevers. Was having nausea, which has resolved. Pt instructed to be NPO until further notice. Pt verbalized understanding.

## 2021-10-18 NOTE — ED Notes (Signed)
Pt transported to [facility]

## 2021-10-18 NOTE — ED Provider Notes (Signed)
[facility] EMERGENCY DEPARTMENT Provider Note   CSN: [id number] Arrival date & time: 10/18/21  1408     History  No chief complaint on file.   Jasmine Krause is a 36 y.o. female who presents the emergency department complaining of right-sided abdominal pain for 3 days.  Patient states that her pain is made worse with movement is less after eating.  She had some intermittent nausea, no vomiting.  No urinary symptoms. She originally went to urgent care, and they had concern for possible appendicitis with McBurney's point tenderness.  Had a normal bowel movement earlier today.  No history of intra-abdominal surgery.  HPI     Home Medications Prior to Admission medications   Medication Sig Start Date End Date Taking? Authorizing Provider  atorvastatin (LIPITOR) 20 MG tablet Take 1 tablet (20 mg total) by mouth daily. 02/05/21   Rema Fendt, NP  metFORMIN (GLUCOPHAGE) 500 MG tablet Take 1 tablet (500 mg total) by mouth daily with breakfast. 02/05/21   Rema Fendt, NP  naproxen (NAPROSYN) 500 MG tablet Take 1 tablet (500 mg total) by mouth 2 (two) times daily with a meal. 01/17/21   Wallis Bamberg, PA-C  norethindrone-ethinyl estradiol-iron (AUROVELA FE 1.5/30) 1.5-30 MG-MCG tablet Take 1 tablet by mouth daily. Continuous use for Dysmenorrhea 09/23/20   Genia Del, MD      Allergies    Patient has no known allergies.    Review of Systems   Review of Systems  Constitutional:  Negative for fever.  Gastrointestinal:  Positive for abdominal pain and nausea. Negative for diarrhea and vomiting.  Genitourinary:  Negative for dysuria, frequency and hematuria.  All other systems reviewed and are negative.   Physical Exam Updated Vital Signs BP 130/83 (BP Location: Right Arm)   Pulse (!) 131   Temp 99.5 F (37.5 C)   Resp 16   LMP 10/05/2021 (Approximate)   SpO2 97%  Physical Exam Vitals and nursing note reviewed.  Constitutional:      Appearance: Normal  appearance.  HENT:     Head: Normocephalic and atraumatic.  Eyes:     Conjunctiva/sclera: Conjunctivae normal.  Cardiovascular:     Rate and Rhythm: Normal rate and regular rhythm.  Pulmonary:     Effort: Pulmonary effort is normal. No respiratory distress.     Breath sounds: Normal breath sounds.  Abdominal:     General: There is no distension.     Palpations: Abdomen is soft.     Tenderness: There is abdominal tenderness in the right upper quadrant.  Skin:    General: Skin is warm and dry.  Neurological:     General: No focal deficit present.     Mental Status: She is alert.     ED Results / Procedures / Treatments   Labs (all labs ordered are listed, but only abnormal results are displayed) Labs Reviewed  COMPREHENSIVE METABOLIC PANEL - Abnormal; Notable for the following components:      Result Value   Sodium 134 (*)    CO2 21 (*)    Glucose, Bld 314 (*)    BUN 5 (*)    All other components within normal limits  CBC - Abnormal; Notable for the following components:   WBC 21.3 (*)    RBC 5.28 (*)    MCH 25.9 (*)    All other components within normal limits  URINALYSIS, ROUTINE W REFLEX MICROSCOPIC - Abnormal; Notable for the following components:   Color, Urine  AMBER (*)    APPearance HAZY (*)    Glucose, UA 150 (*)    Hgb urine dipstick SMALL (*)    Protein, ur 100 (*)    Bacteria, UA RARE (*)    All other components within normal limits  LIPASE, BLOOD  I-STAT BETA HCG BLOOD, ED ([facility], [facility], [facility] ONLY)    EKG None  Radiology US Abdomen Limited RUQ (LIVER/GB)  Result Date: 10/18/2021 CLINICAL DATA:  Right upper quadrant pain EXAM: ULTRASOUND ABDOMEN LIMITED RIGHT UPPER QUADRANT COMPARISON:  None Available. FINDINGS: Gallbladder: Gallbladder is well distended with multiple gallstones. A stone is noted in the gallbladder neck which is non mobile. Mild gallbladder wall thickening is noted. Gallbladder sludge is seen as well. Common bile duct: Diameter: 4.9 mm. Liver:  Diffuse increased echogenicity is noted consistent with fatty infiltration. No focal mass is noted. Portal vein is patent on color Doppler imaging with normal direction of blood flow towards the liver. Other: None. IMPRESSION: Fatty liver. Cholelithiasis with evidence of a single stone lodged in the gallbladder neck which is non mobile. Gallbladder sludge and wall thickening is noted as well. These changes are consistent with calculous cholecystitis in the appropriate clinical setting. Electronically Signed   By: Alcide Clever M.D.   On: 10/18/2021 22:10   CT ABDOMEN PELVIS W CONTRAST  Result Date: 10/18/2021 CLINICAL DATA:  Nausea vomiting with right lower quadrant pain. EXAM: CT ABDOMEN AND PELVIS WITH CONTRAST TECHNIQUE: Multidetector CT imaging of the abdomen and pelvis was performed using the standard protocol following bolus administration of intravenous contrast. RADIATION DOSE REDUCTION: This exam was performed according to the departmental dose-optimization program which includes automated exposure control, adjustment of the mA and/or kV according to patient size and/or use of iterative reconstruction technique. CONTRAST:  OMNIPAQUE IOHEXOL 300 MG/ML  SOLN COMPARISON:  None Available. FINDINGS: Lower chest: Unremarkable. Hepatobiliary: The liver shows diffusely decreased attenuation suggesting fat deposition. Calcified gallstones identified measuring up to approximately 2.6 cm diameter. Diffuse gallbladder wall thickening evident. No intrahepatic or extrahepatic biliary dilation. Pancreas: No focal mass lesion. No dilatation of the main duct. No intraparenchymal cyst. No peripancreatic edema. Spleen: No splenomegaly. No focal mass lesion. Adrenals/Urinary Tract: No adrenal nodule or mass. Kidneys unremarkable. No evidence for hydroureter. The urinary bladder appears normal for the degree of distention. Stomach/Bowel: Stomach is unremarkable. No gastric wall thickening. No evidence of outlet  obstruction. Duodenum is normally positioned as is the ligament of Treitz. No small bowel wall thickening. No small bowel dilatation. The terminal ileum is normal. The appendix is normal. No gross colonic mass. No colonic wall thickening. Vascular/Lymphatic: No abdominal aortic aneurysm. No abdominal aortic atherosclerotic calcification. There is no gastrohepatic or hepatoduodenal ligament lymphadenopathy. No retroperitoneal or mesenteric lymphadenopathy. Prominent portal caval lymph node is likely reactive. No pelvic sidewall lymphadenopathy. Reproductive: The uterus is unremarkable.  There is no adnexal mass. Other: Trace free fluid noted in the right adnexal space posteriorly. Musculoskeletal: No worrisome lytic or sclerotic osseous abnormality. IMPRESSION: 1. Cholelithiasis with diffuse gallbladder wall thickening. Acute cholecystitis not excluded. Right upper quadrant ultrasound may prove helpful to further evaluate. 2. Normal appendix and terminal ileum. 3. Trace free fluid in the right adnexal space posteriorly, nonspecific but likely physiologic. 4. Hepatic steatosis. Electronically Signed   By: Kennith Center M.D.   On: 10/18/2021 21:07    Procedures Procedures    Medications Ordered in ED Medications  piperacillin-tazobactam (ZOSYN) IVPB 3.375 g (3.375 g Intravenous New Bag/Given 10/18/21 2243)  iohexol (OMNIPAQUE) 300 MG/ML solution 100 mL (100 mLs Intravenous Contrast Given 10/18/21 2057)    ED Course/ Medical Decision Making/ A&P                           Medical Decision Making Amount and/or Complexity of Data Reviewed Radiology: ordered.  Risk Prescription drug management. Decision regarding hospitalization.  This patient is a 36 y.o. female  who presents to the ED for concern of right sided abdominal pain x 3 days.   Differential diagnoses prior to evaluation: The emergent differential diagnosis includes, but is not limited to,  AAA, mesenteric ischemia, appendicitis,  diverticulitis, DKA, gastritis/gastroenteritis, nephrolithiasis, pancreatitis, constipation, UTI, bowel obstruction, biliary disease, IBD, PUD, hepatitis, ectopic pregnancy, ovarian torsion, PID. This is not an exhaustive differential.   Past Medical History / Co-morbidities: Diabetes, hyperlipidemia  Additional history: Chart reviewed. Pertinent results include: Patient was seen at urgent care, and after providers evaluation they had concern for acute appendicitis so sent her to the ER for imaging and evaluation.  Physical Exam: Physical exam performed. The pertinent findings include: Patient tachycardic in the 130s, afebrile, no acute distress.  Epigastric and right upper quadrant tenderness to palpation, with mild guarding.  Lab Tests/Imaging studies: I personally interpreted labs/imaging and the pertinent results include: Leukocytosis of 21.3.  Glucose 314, otherwise electrolytes grossly within normal limits.  Normal kidney and liver function.  Normal lipase.  Urinalysis with small hemoglobin, negative for infection.  CT abdomen pelvis with cholelithiasis and diffuse gallbladder wall thickening, normal appendix.  Right upper quadrant ultrasound findings consistent with calculous cholecystitis.    Medications: I ordered medication including antibiotics for acute cholecystitis.  I have reviewed the patients home medicines and have made adjustments as needed.  Consultations obtained: I consulted with Dr Janee Morn with general surgery who will evaluate the patient and admit.    Disposition: After consideration of the diagnostic results and the patients response to treatment, I feel that patient is requiring admission for surgical treatment of acute cholecystitis.   Final Clinical Impression(s) / ED Diagnoses Final diagnoses:  Cholecystitis    Rx / DC Orders ED Discharge Orders     None      Portions of this report may have been transcribed using voice recognition software. Every  effort was made to ensure accuracy; however, inadvertent computerized transcription errors may be present.    Su Monks, PA-C 10/18/21 2308    Melene Plan, DO 10/18/21 2312

## 2021-10-18 NOTE — Discharge Instructions (Addendum)
Advised to report to the emergency room for evaluation of possible acute appendicitis.

## 2021-10-18 NOTE — Telephone Encounter (Signed)
Msg came back as unread. Msg sent to scheduling to reach out to schedule. Per Debarah Crape, she left msg to call back and schedule.

## 2021-10-18 NOTE — ED Provider Triage Note (Signed)
Emergency Medicine Provider Triage Evaluation Note  Jasmine Krause , a 36 y.o. female  was evaluated in triage.  Pt complains of right sided abdominal pain for the last 2 days.  She endorses some intermittent nausea and vomiting.  She denies fever, she has no acute distress.  She has subacute fever in triage with temperature of 99.2.  She was sent from urgent care for evaluation of possible appendicitis with some McBurney's point tenderness.  She reports that she has been having bowel movements with 1 episode of diarrhea on Saturday.  No previous history of intra-abdominal surgery..  Review of Systems  Positive: Abdominal pain, NV Negative: Fever, chills, dysuria, hematuria  Physical Exam  BP (!) 160/103 (BP Location: Right Arm)   Pulse (!) 105   Temp 99.2 F (37.3 C) (Oral)   Resp 16   LMP 10/05/2021 (Approximate)   SpO2 100%  Gen:   Awake, no distress   Resp:  Normal effort  MSK:   Moves extremities without difficulty  Other:  TTP rlq / ruq, no rebound, rigidity, guarding. No distress  Medical Decision Making  Medically screening exam initiated at 3:10 PM.  Appropriate orders placed.  Cameron Proud was informed that the remainder of the evaluation will be completed by another provider, this initial triage assessment does not replace that evaluation, and the importance of remaining in the ED until their evaluation is complete.  Workup initiated   Olene Floss, [place] 10/18/21 1512

## 2021-10-18 NOTE — H&P (Signed)
Jasmine Krause is an 36 y.o. female.   Chief Complaint: RUQ pain HPI: 36yo F presented to the ED C/O 2d HX dull RUQ pain.  She came to the emergency department for further evaluation.  Work-up included white blood cell count 21,300, liver function test normal, lipase normal.  CT scan of the abdomen pelvis was done which suggested cholecystitis.  Right upper quadrant ultrasound showed gallstones with a stone lodged in the neck of the gallbladder consistent with cholecystitis.  I was asked to see her for surgical management.  Past Medical History:  Diagnosis Date   Abnormal Pap smear of cervix    09-23-20 neg HPV HR+   High cholesterol    Obesity     History reviewed. No pertinent surgical history.  Family History  Problem Relation Age of Onset   Diabetes Mother    Hypertension Mother    Diabetes Maternal Grandmother    Hypertension Maternal Grandmother    Other Neg Hx    Social History:  reports that she has quit smoking. She has never used smokeless tobacco. She reports current alcohol use. She reports that she does not use drugs.  Allergies: No Known Allergies  (Not in a hospital admission)   Results for orders placed or performed during the hospital encounter of 10/18/21 (from the past 48 hour(s))  Lipase, blood     Status: None   Collection Time: 10/18/21  3:25 PM  Result Value Ref Range   Lipase 31 11 - 51 U/L    Comment: Performed at [facility], [address] [place], [place] [address]  Comprehensive metabolic panel     Status: Abnormal   Collection Time: 10/18/21  3:25 PM  Result Value Ref Range   Sodium 134 (L) 135 - 145 mmol/L   Potassium 3.8 3.5 - 5.1 mmol/L   Chloride 102 98 - 111 mmol/L   CO2 21 (L) 22 - 32 mmol/L   Glucose, Bld 314 (H) 70 - 99 mg/dL    Comment: Glucose reference range applies only to samples taken after fasting for at least 8 hours.   BUN 5 (L) 6 - 20 mg/dL   Creatinine, Ser 7.74 0.44 - 1.00 mg/dL   Calcium 9.3 8.9 - 12.8 mg/dL    Total Protein 8.0 6.5 - 8.1 g/dL   Albumin 3.6 3.5 - 5.0 g/dL   AST 19 15 - 41 U/L   ALT 24 0 - 44 U/L   Alkaline Phosphatase 80 38 - 126 U/L   Total Bilirubin 0.5 0.3 - 1.2 mg/dL   GFR, Estimated >78 >67 mL/min    Comment: (NOTE) Calculated using the CKD-EPI Creatinine Equation (2021)    Anion gap 11 5 - 15    Comment: Performed at [facility], [address], [place], [place] [address]  CBC     Status: Abnormal   Collection Time: 10/18/21  3:25 PM  Result Value Ref Range   WBC 21.3 (H) 4.0 - 10.5 K/uL   RBC 5.28 (H) 3.87 - 5.11 MIL/uL   Hemoglobin 13.7 12.0 - 15.0 g/dL   HCT 47.0 96.2 - 83.6 %   MCV 83.1 80.0 - 100.0 fL   MCH 25.9 (L) 26.0 - 34.0 pg   MCHC 31.2 30.0 - 36.0 g/dL   RDW 62.9 47.6 - 54.6 %   Platelets 362 150 - 400 K/uL   nRBC 0.0 0.0 - 0.2 %    Comment: Performed at [facility], [address] [place], [place] [address]  Urinalysis, Routine w reflex microscopic     Status: Abnormal   Collection Time: 10/18/21  3:30 PM  Result Value Ref Range   Color, Urine AMBER (A) YELLOW    Comment: BIOCHEMICALS MAY BE AFFECTED BY COLOR   APPearance HAZY (A) CLEAR   Specific Gravity, Urine 1.024 1.005 - 1.030   pH 6.0 5.0 - 8.0   Glucose, UA 150 (A) NEGATIVE mg/dL   Hgb urine dipstick SMALL (A) NEGATIVE   Bilirubin Urine NEGATIVE NEGATIVE   Ketones, ur NEGATIVE NEGATIVE mg/dL   Protein, ur 665 (A) NEGATIVE mg/dL   Nitrite NEGATIVE NEGATIVE   Leukocytes,Ua NEGATIVE NEGATIVE   RBC / HPF 0-5 0 - 5 RBC/hpf   WBC, UA 0-5 0 - 5 WBC/hpf   Bacteria, UA RARE (A) NONE SEEN   Squamous Epithelial / LPF 0-5 0 - 5   Mucus PRESENT     Comment: Performed at [facility], [address] [place], [place] [address]  I-Stat beta hCG blood, ED     Status: None   Collection Time: 10/18/21  4:00 PM  Result Value Ref Range   I-stat hCG, quantitative <5.0 <5 mIU/mL   Comment 3            Comment:   GEST. AGE      CONC.  (mIU/mL)   <=1 WEEK        5 - 50     2 WEEKS        50 - 500     3 WEEKS       100 - 10,000     4 WEEKS     1,000 - 30,000        FEMALE AND NON-PREGNANT FEMALE:     LESS THAN 5 mIU/mL    US Abdomen Limited RUQ (LIVER/GB)  Result Date: 10/18/2021 CLINICAL DATA:  Right upper quadrant pain EXAM: ULTRASOUND ABDOMEN LIMITED RIGHT UPPER QUADRANT COMPARISON:  None Available. FINDINGS: Gallbladder: Gallbladder is well distended with multiple gallstones. A stone is noted in the gallbladder neck which is non mobile. Mild gallbladder wall thickening is noted. Gallbladder sludge is seen as well. Common bile duct: Diameter: 4.9 mm. Liver: Diffuse increased echogenicity is noted consistent with fatty infiltration. No focal mass is noted. Portal vein is patent on color Doppler imaging with normal direction of blood flow towards the liver. Other: None. IMPRESSION: Fatty liver. Cholelithiasis with evidence of a single stone lodged in the gallbladder neck which is non mobile. Gallbladder sludge and wall thickening is noted as well. These changes are consistent with calculous cholecystitis in the appropriate clinical setting. Electronically Signed   By: Alcide Clever M.D.   On: 10/18/2021 22:10   CT ABDOMEN PELVIS W CONTRAST  Result Date: 10/18/2021 CLINICAL DATA:  Nausea vomiting with right lower quadrant pain. EXAM: CT ABDOMEN AND PELVIS WITH CONTRAST TECHNIQUE: Multidetector CT imaging of the abdomen and pelvis was performed using the standard protocol following bolus administration of intravenous contrast. RADIATION DOSE REDUCTION: This exam was performed according to the departmental dose-optimization program which includes automated exposure control, adjustment of the mA and/or kV according to patient size and/or use of iterative reconstruction technique. CONTRAST:  OMNIPAQUE IOHEXOL 300 MG/ML  SOLN COMPARISON:  None Available. FINDINGS: Lower chest: Unremarkable. Hepatobiliary: The liver shows diffusely decreased attenuation suggesting fat deposition.  Calcified gallstones identified measuring up to approximately 2.6 cm diameter. Diffuse gallbladder wall thickening evident. No intrahepatic or  extrahepatic biliary dilation. Pancreas: No focal mass lesion. No dilatation of the main duct. No intraparenchymal cyst. No peripancreatic edema. Spleen: No splenomegaly. No focal mass lesion. Adrenals/Urinary Tract: No adrenal nodule or mass. Kidneys unremarkable. No evidence for hydroureter. The urinary bladder appears normal for the degree of distention. Stomach/Bowel: Stomach is unremarkable. No gastric wall thickening. No evidence of outlet obstruction. Duodenum is normally positioned as is the ligament of Treitz. No small bowel wall thickening. No small bowel dilatation. The terminal ileum is normal. The appendix is normal. No gross colonic mass. No colonic wall thickening. Vascular/Lymphatic: No abdominal aortic aneurysm. No abdominal aortic atherosclerotic calcification. There is no gastrohepatic or hepatoduodenal ligament lymphadenopathy. No retroperitoneal or mesenteric lymphadenopathy. Prominent portal caval lymph node is likely reactive. No pelvic sidewall lymphadenopathy. Reproductive: The uterus is unremarkable.  There is no adnexal mass. Other: Trace free fluid noted in the right adnexal space posteriorly. Musculoskeletal: No worrisome lytic or sclerotic osseous abnormality. IMPRESSION: 1. Cholelithiasis with diffuse gallbladder wall thickening. Acute cholecystitis not excluded. Right upper quadrant ultrasound may prove helpful to further evaluate. 2. Normal appendix and terminal ileum. 3. Trace free fluid in the right adnexal space posteriorly, nonspecific but likely physiologic. 4. Hepatic steatosis. Electronically Signed   By: Misty Stanley M.D.   On: 10/18/2021 21:07    Review of Systems  Constitutional:  Positive for appetite change.  HENT: Negative.    Eyes: Negative.   Respiratory: Negative.    Cardiovascular: Negative.   Gastrointestinal:   Positive for abdominal pain. Negative for blood in stool, diarrhea and vomiting.  Endocrine: Negative.   Genitourinary: Negative.   Musculoskeletal: Negative.   Allergic/Immunologic: Negative.   Neurological: Negative.   Hematological: Negative.   Psychiatric/Behavioral: Negative.      Blood pressure 130/83, pulse (!) 131, temperature 99.5 F (37.5 C), resp. rate 16, last menstrual period 10/05/2021, SpO2 97 %. Physical Exam HENT:     Head: Normocephalic.     Mouth/Throat:     Mouth: Mucous membranes are dry.  Cardiovascular:     Rate and Rhythm: Normal rate and regular rhythm.     Pulses: Normal pulses.     Heart sounds: Normal heart sounds.  Pulmonary:     Effort: Pulmonary effort is normal.     Breath sounds: Normal breath sounds. No wheezing.  Abdominal:     General: Abdomen is flat. There is no distension.     Palpations: Abdomen is soft.     Tenderness: There is abdominal tenderness.     Comments: Right upper quadrant tenderness without guarding or peritonitis  Musculoskeletal:        General: Normal range of motion.     Cervical back: Neck supple.  Skin:    General: Skin is warm.     Capillary Refill: Capillary refill takes less than 2 seconds.  Neurological:     Mental Status: She is alert and oriented to person, place, and time.  Psychiatric:        Mood and Affect: Mood normal.      Assessment/Plan Acute cholecystitis -admit, she received Zosyn in the emergency department so we will continue that.  Plan laparoscopic cholecystectomy with possible cholangiogram tomorrow.  I will discuss this further with Dr. Zenia Resides.  I discussed the procedure, risks, and benefits with the patient and I discussed the expected postoperative course.  She is agreeable.  Hyperglycemia on CMP -we will check CBGs and plan SSI if needed.  She denies history of diabetes.  Merri Ray  Grandville Silos, MD 10/18/2021, 11:23 PM

## 2021-10-18 NOTE — ED Provider Notes (Signed)
[facility]    CSN: [id number] Arrival date & time: 10/18/21  1229      History   Chief Complaint Chief Complaint  Patient presents with   Abdominal Pain    HPI Jasmine Krause is a 36 y.o. female.   56-year-old female presents with abdominal pain, nausea and vomiting, fever.  Dates that Saturday she started having right upper quadrant abdominal pain, this was constant and then Sunday morning it moved down into the right lower quadrant.  She relates that the pain has been in the right lower quadrant since Sunday, pain is worse when she moves, walks.  She relates she has had nausea and has had several episodes of vomiting.  Patient also relates that she has had mild fever, and chills.  No diarrhea.  Patient relates her symptoms started after eating at [employer].  Patient relates she does not have any frequency, urgency or dysuria.   Abdominal Pain Associated symptoms: vomiting     Past Medical History:  Diagnosis Date   Abnormal Pap smear of cervix    09-23-20 neg HPV HR+   High cholesterol    Obesity     Patient Active Problem List   Diagnosis Date Noted   Prediabetes 06/18/2020   Hyperlipidemia 06/18/2020   Morbid obesity (HCC) 05/15/2013    History reviewed. No pertinent surgical history.  OB History     Gravida  1   Para  1   Term  1   Preterm  0   AB  0   Living  1      SAB  0   IAB  0   Ectopic  0   Multiple  0   Live Births  1            Home Medications    Prior to Admission medications   Medication Sig Start Date End Date Taking? Authorizing Provider  norethindrone-ethinyl estradiol-iron (AUROVELA FE 1.5/30) 1.5-30 MG-MCG tablet Take 1 tablet by mouth daily. Continuous use for Dysmenorrhea 09/23/20  Yes Genia Del, MD  atorvastatin (LIPITOR) 20 MG tablet Take 1 tablet (20 mg total) by mouth daily. 02/05/21   Rema Fendt, NP  metFORMIN (GLUCOPHAGE) 500 MG tablet Take 1 tablet (500 mg total) by mouth daily with  breakfast. 02/05/21   Rema Fendt, NP  naproxen (NAPROSYN) 500 MG tablet Take 1 tablet (500 mg total) by mouth 2 (two) times daily with a meal. 01/17/21   Wallis Bamberg, PA-C    Family History Family History  Problem Relation Age of Onset   Diabetes Mother    Hypertension Mother    Diabetes Maternal Grandmother    Hypertension Maternal Grandmother    Other Neg Hx     Social History Social History   Tobacco Use   Smoking status: Former   Smokeless tobacco: Never  Building services engineer Use: Former  Substance Use Topics   Alcohol use: Yes    Comment: occasionally   Drug use: No     Allergies   Patient has no known allergies.   Review of Systems Review of Systems  Gastrointestinal:  Positive for abdominal pain and vomiting.     Physical Exam Triage Vital Signs ED Triage Vitals  Enc Vitals Group     BP 10/18/21 1339 133/88     Pulse Rate 10/18/21 1339 (!) 139     Resp 10/18/21 1339 (!) 22     Temp 10/18/21 1339 99 F (37.2  C)     Temp Source 10/18/21 1339 Oral     SpO2 10/18/21 1339 97 %     Weight --      Height --      Head Circumference --      Peak Flow --      Pain Score 10/18/21 1340 8     Pain Loc --      Pain Edu? --      Excl. in GC? --    No data found.  Updated Vital Signs BP 133/88   Pulse (!) 139   Temp 99 F (37.2 C) (Oral)   Resp (!) 22   LMP 10/05/2021 (Approximate)   SpO2 97%   Visual Acuity Right Eye Distance:   Left Eye Distance:   Bilateral Distance:    Right Eye Near:   Left Eye Near:    Bilateral Near:     Physical Exam Constitutional:      Appearance: She is well-developed.  Cardiovascular:     Rate and Rhythm: Normal rate and regular rhythm.     Heart sounds: Normal heart sounds.  Pulmonary:     Effort: Pulmonary effort is normal.     Breath sounds: Normal breath sounds and air entry. No wheezing, rhonchi or rales.  Abdominal:     General: Abdomen is flat. Bowel sounds are normal.     Palpations: Abdomen is  soft.     Tenderness: There is abdominal tenderness in the right lower quadrant. There is guarding. Positive signs include McBurney's sign.  Neurological:     Mental Status: She is alert.      UC Treatments / Results  Labs (all labs ordered are listed, but only abnormal results are displayed) Labs Reviewed  POCT URINALYSIS DIPSTICK, ED / UC - Abnormal; Notable for the following components:      Result Value   Glucose, UA >=1000 (*)    All other components within normal limits  POC URINE PREG, ED    EKG   Radiology No results found.  Procedures Procedures (including critical care time)  Medications Ordered in UC Medications - No data to display  Initial Impression / Assessment and Plan / UC Course  I have reviewed the triage vital signs and the nursing notes.  Pertinent labs & imaging results that were available during my care of the patient were reviewed by me and considered in my medical decision making (see chart for details).    Plan: 1.  Patient advised to report to the emergency room for evaluation of acute appendicitis. 2.  Return as needed Final Clinical Impressions(s) / UC Diagnoses   Final diagnoses:  Fever, unspecified  Right lower quadrant abdominal pain  Acute appendicitis, unspecified acute appendicitis type     Discharge Instructions      Advised to report to the emergency room for evaluation of possible acute appendicitis.    ED Prescriptions   None    PDMP not reviewed this encounter.   Ellsworth Lennox, PA-C 10/18/21 1446

## 2021-10-18 NOTE — ED Triage Notes (Signed)
Patient sent from [facility] for further evaluation of right sided abdominal pain constant x 2 days. Reports nausea and vomiting with same. Denies fever. NAD

## 2021-10-19 ENCOUNTER — Encounter (HOSPITAL_COMMUNITY): Payer: Self-pay | Admitting: General Surgery

## 2021-10-19 ENCOUNTER — Observation Stay (HOSPITAL_COMMUNITY): Payer: No Typology Code available for payment source | Admitting: Critical Care Medicine

## 2021-10-19 ENCOUNTER — Encounter (HOSPITAL_COMMUNITY)
Admission: EM | Disposition: A | Discharge status: Home / Self Care | Payer: Self-pay | Source: Home / Self Care | Attending: Emergency Medicine

## 2021-10-19 ENCOUNTER — Other Ambulatory Visit: Payer: Self-pay

## 2021-10-19 ENCOUNTER — Observation Stay (HOSPITAL_BASED_OUTPATIENT_CLINIC_OR_DEPARTMENT_OTHER): Payer: No Typology Code available for payment source | Admitting: Critical Care Medicine

## 2021-10-19 DIAGNOSIS — K810 Acute cholecystitis: Secondary | ICD-10-CM

## 2021-10-19 DIAGNOSIS — E119 Type 2 diabetes mellitus without complications: Secondary | ICD-10-CM

## 2021-10-19 DIAGNOSIS — Z6841 Body Mass Index (BMI) 40.0 and over, adult: Secondary | ICD-10-CM | POA: Diagnosis not present

## 2021-10-19 HISTORY — PX: CHOLECYSTECTOMY: SHX55

## 2021-10-19 LAB — HEMOGLOBIN A1C
Hgb A1c MFr Bld: 10.5 % — ABNORMAL HIGH (ref 4.8–5.6)
Mean Plasma Glucose: 254.65 mg/dL

## 2021-10-19 LAB — GLUCOSE, CAPILLARY
Glucose-Capillary: 274 mg/dL — ABNORMAL HIGH (ref 70–99)
Glucose-Capillary: 289 mg/dL — ABNORMAL HIGH (ref 70–99)
Glucose-Capillary: 325 mg/dL — ABNORMAL HIGH (ref 70–99)
Glucose-Capillary: 287 mg/dL — ABNORMAL HIGH (ref 70–99)
Glucose-Capillary: 259 mg/dL — ABNORMAL HIGH (ref 70–99)
Glucose-Capillary: 259 mg/dL — ABNORMAL HIGH (ref 70–99)
Glucose-Capillary: 246 mg/dL — ABNORMAL HIGH (ref 70–99)

## 2021-10-19 LAB — COMPREHENSIVE METABOLIC PANEL
ALT: 22 U/L (ref 0–44)
AST: 18 U/L (ref 15–41)
Albumin: 3 g/dL — ABNORMAL LOW (ref 3.5–5.0)
Alkaline Phosphatase: 70 U/L (ref 38–126)
Anion gap: 10 (ref 5–15)
BUN: 5 mg/dL — ABNORMAL LOW (ref 6–20)
CO2: 22 mmol/L (ref 22–32)
Calcium: 9.1 mg/dL (ref 8.9–10.3)
Chloride: 102 mmol/L (ref 98–111)
Creatinine, Ser: 0.7 mg/dL (ref 0.44–1.00)
GFR, Estimated: >60 mL/min (ref >60)
Glucose, Bld: 264 mg/dL — ABNORMAL HIGH (ref 70–99)
Potassium: 3.5 mmol/L (ref 3.5–5.1)
Sodium: 134 mmol/L — ABNORMAL LOW (ref 135–145)
Total Bilirubin: 0.6 mg/dL (ref 0.3–1.2)
Total Protein: 7 g/dL (ref 6.5–8.1)

## 2021-10-19 LAB — CBC
HCT: 37.7 % (ref 36.0–46.0)
Hemoglobin: 12.7 g/dL (ref 12.0–15.0)
MCH: 27.1 pg (ref 26.0–34.0)
MCHC: 33.7 g/dL (ref 30.0–36.0)
MCV: 80.6 fL (ref 80.0–100.0)
Platelets: 308 10*3/uL (ref 150–400)
RBC: 4.68 MIL/uL (ref 3.87–5.11)
RDW: 13.8 % (ref 11.5–15.5)
WBC: 16.3 10*3/uL — ABNORMAL HIGH (ref 4.0–10.5)
nRBC: 0 % (ref 0.0–0.2)

## 2021-10-19 LAB — MRSA NEXT GEN BY PCR, NASAL: MRSA by PCR Next Gen: DETECTED — AB

## 2021-10-19 LAB — HIV ANTIBODY (ROUTINE TESTING W REFLEX): HIV Screen 4th Generation wRfx: NONREACTIVE

## 2021-10-19 SURGERY — LAPAROSCOPIC CHOLECYSTECTOMY
Anesthesia: General | Site: Abdomen

## 2021-10-19 MED ORDER — DIPHENHYDRAMINE HCL 50 MG/ML IJ SOLN: 25.0000 mg | Freq: Four times a day (QID) | INTRAMUSCULAR | Status: DC | PRN

## 2021-10-19 MED ORDER — MIDAZOLAM HCL 2 MG/2ML IJ SOLN
INTRAMUSCULAR | Status: AC
  Filled 2021-10-19: qty 2

## 2021-10-19 MED ORDER — MORPHINE SULFATE (PF) 4 MG/ML IV SOLN
4.0000 mg | INTRAVENOUS | Status: DC | PRN
  Administered 2021-10-19 – 2021-10-20 (×3): 4 mg via INTRAVENOUS
  Filled 2021-10-19 (×3): qty 1

## 2021-10-19 MED ORDER — AMISULPRIDE (ANTIEMETIC) 5 MG/2ML IV SOLN
INTRAVENOUS | Status: AC
  Filled 2021-10-19: qty 2

## 2021-10-19 MED ORDER — INSULIN ASPART 100 UNIT/ML IJ SOLN
0.0000 [IU] | INTRAMUSCULAR | Status: DC
  Administered 2021-10-19 (×3): 8 [IU] via SUBCUTANEOUS
  Administered 2021-10-19: 5 [IU] via SUBCUTANEOUS
  Administered 2021-10-19: 8 [IU] via SUBCUTANEOUS
  Administered 2021-10-20 (×2): 5 [IU] via SUBCUTANEOUS

## 2021-10-19 MED ORDER — SUCCINYLCHOLINE CHLORIDE 200 MG/10ML IV SOSY
PREFILLED_SYRINGE | INTRAVENOUS | Status: DC | PRN
  Administered 2021-10-19: 140 mg via INTRAVENOUS

## 2021-10-19 MED ORDER — PROPOFOL 10 MG/ML IV BOLUS
INTRAVENOUS | Status: AC
  Filled 2021-10-19: qty 20

## 2021-10-19 MED ORDER — OXYCODONE HCL 5 MG/5ML PO SOLN
ORAL | Status: AC
  Filled 2021-10-19: qty 5

## 2021-10-19 MED ORDER — PROPOFOL 10 MG/ML IV BOLUS
INTRAVENOUS | Status: AC
Start: 2021-10-19 — End: ?
  Filled 2021-10-19: qty 20

## 2021-10-19 MED ORDER — DIPHENHYDRAMINE HCL 25 MG PO CAPS: 25.0000 mg | ORAL_CAPSULE | Freq: Four times a day (QID) | ORAL | Status: DC | PRN

## 2021-10-19 MED ORDER — ACETAMINOPHEN 650 MG RE SUPP: 650.0000 mg | Freq: Four times a day (QID) | RECTAL | Status: DC | PRN

## 2021-10-19 MED ORDER — SIMETHICONE 80 MG PO CHEW
80.0000 mg | CHEWABLE_TABLET | Freq: Four times a day (QID) | ORAL | Status: DC | PRN
  Administered 2021-10-19 (×2): 80 mg via ORAL
  Filled 2021-10-19 (×2): qty 1

## 2021-10-19 MED ORDER — ROCURONIUM BROMIDE 10 MG/ML (PF) SYRINGE
PREFILLED_SYRINGE | INTRAVENOUS | Status: DC | PRN
  Administered 2021-10-19: 50 mg via INTRAVENOUS
  Administered 2021-10-19 (×4): 10 mg via INTRAVENOUS

## 2021-10-19 MED ORDER — ONDANSETRON HCL 4 MG/2ML IJ SOLN
INTRAMUSCULAR | Status: DC | PRN
  Administered 2021-10-19: 4 mg via INTRAVENOUS

## 2021-10-19 MED ORDER — INSULIN ASPART 100 UNIT/ML IJ SOLN
8.0000 [IU] | Freq: Once | INTRAMUSCULAR | Status: AC
  Administered 2021-10-19: 8 [IU] via SUBCUTANEOUS

## 2021-10-19 MED ORDER — FENTANYL CITRATE (PF) 100 MCG/2ML IJ SOLN
INTRAMUSCULAR | Status: AC
  Filled 2021-10-19: qty 2

## 2021-10-19 MED ORDER — OXYCODONE HCL 5 MG PO TABS
5.0000 mg | ORAL_TABLET | ORAL | Status: DC | PRN
  Administered 2021-10-19: 10 mg via ORAL
  Administered 2021-10-19: 5 mg via ORAL
  Administered 2021-10-19 – 2021-10-20 (×3): 10 mg via ORAL
  Filled 2021-10-19 (×4): qty 2
  Filled 2021-10-19: qty 1

## 2021-10-19 MED ORDER — FENTANYL CITRATE (PF) 100 MCG/2ML IJ SOLN
25.0000 ug | INTRAMUSCULAR | Status: DC | PRN
  Administered 2021-10-19 (×3): 50 ug via INTRAVENOUS

## 2021-10-19 MED ORDER — BUPIVACAINE-EPINEPHRINE 0.25% -1:200000 IJ SOLN
INTRAMUSCULAR | Status: DC | PRN
  Administered 2021-10-19: 30 mL

## 2021-10-19 MED ORDER — OXYCODONE HCL 5 MG/5ML PO SOLN
5.0000 mg | Freq: Once | ORAL | Status: AC | PRN
  Administered 2021-10-19: 5 mg via ORAL

## 2021-10-19 MED ORDER — ONDANSETRON HCL 4 MG/2ML IJ SOLN: 4.0000 mg | Freq: Four times a day (QID) | INTRAMUSCULAR | Status: DC | PRN

## 2021-10-19 MED ORDER — BUPIVACAINE-EPINEPHRINE (PF) 0.25% -1:200000 IJ SOLN
INTRAMUSCULAR | Status: AC
  Filled 2021-10-19: qty 30

## 2021-10-19 MED ORDER — PROPOFOL 10 MG/ML IV BOLUS
INTRAVENOUS | Status: DC | PRN
  Administered 2021-10-19: 200 mg via INTRAVENOUS

## 2021-10-19 MED ORDER — SODIUM CHLORIDE 0.9 % IR SOLN
Status: DC | PRN
  Administered 2021-10-19: 2000 mL

## 2021-10-19 MED ORDER — 0.9 % SODIUM CHLORIDE (POUR BTL) OPTIME
TOPICAL | Status: DC | PRN
  Administered 2021-10-19: 1000 mL

## 2021-10-19 MED ORDER — ACETAMINOPHEN 500 MG PO TABS: 1000.0000 mg | ORAL_TABLET | Freq: Once | ORAL | Status: DC | PRN

## 2021-10-19 MED ORDER — ACETAMINOPHEN 10 MG/ML IV SOLN
1000.0000 mg | Freq: Once | INTRAVENOUS | Status: DC | PRN
  Administered 2021-10-19: 1000 mg via INTRAVENOUS

## 2021-10-19 MED ORDER — ACETAMINOPHEN 10 MG/ML IV SOLN
INTRAVENOUS | Status: AC
  Filled 2021-10-19: qty 100

## 2021-10-19 MED ORDER — AMISULPRIDE (ANTIEMETIC) 5 MG/2ML IV SOLN
INTRAVENOUS | Status: DC | PRN
  Administered 2021-10-19: 5 mg via INTRAVENOUS

## 2021-10-19 MED ORDER — PHENYLEPHRINE 80 MCG/ML (10ML) SYRINGE FOR IV PUSH (FOR BLOOD PRESSURE SUPPORT)
PREFILLED_SYRINGE | INTRAVENOUS | Status: DC | PRN
  Administered 2021-10-19: 40 ug via INTRAVENOUS
  Administered 2021-10-19: 80 ug via INTRAVENOUS

## 2021-10-19 MED ORDER — MIDAZOLAM HCL 5 MG/5ML IJ SOLN
INTRAMUSCULAR | Status: DC | PRN
  Administered 2021-10-19: 2 mg via INTRAVENOUS

## 2021-10-19 MED ORDER — FENTANYL CITRATE (PF) 250 MCG/5ML IJ SOLN
INTRAMUSCULAR | Status: AC
  Filled 2021-10-19: qty 5

## 2021-10-19 MED ORDER — SUGAMMADEX SODIUM 200 MG/2ML IV SOLN
INTRAVENOUS | Status: DC | PRN
  Administered 2021-10-19: 300 mg via INTRAVENOUS

## 2021-10-19 MED ORDER — ONDANSETRON 4 MG PO TBDP
4.0000 mg | ORAL_TABLET | Freq: Four times a day (QID) | ORAL | Status: DC | PRN
  Administered 2021-10-20: 4 mg via ORAL
  Filled 2021-10-19: qty 1

## 2021-10-19 MED ORDER — LACTATED RINGERS IV SOLN: INTRAVENOUS | Status: DC | PRN

## 2021-10-19 MED ORDER — PIPERACILLIN-TAZOBACTAM 3.375 G IVPB
3.3750 g | Freq: Three times a day (TID) | INTRAVENOUS | Status: AC
  Administered 2021-10-19 – 2021-10-20 (×4): 3.375 g via INTRAVENOUS
  Filled 2021-10-19 (×4): qty 50

## 2021-10-19 MED ORDER — OXYCODONE HCL 5 MG PO TABS: 5.0000 mg | ORAL_TABLET | Freq: Once | ORAL | Status: AC | PRN

## 2021-10-19 MED ORDER — FENTANYL CITRATE (PF) 250 MCG/5ML IJ SOLN
INTRAMUSCULAR | Status: DC | PRN
  Administered 2021-10-19 (×5): 50 ug via INTRAVENOUS

## 2021-10-19 MED ORDER — ACETAMINOPHEN 325 MG PO TABS: 650.0000 mg | ORAL_TABLET | Freq: Four times a day (QID) | ORAL | Status: DC | PRN

## 2021-10-19 MED ORDER — ACETAMINOPHEN 160 MG/5ML PO SOLN: 1000.0000 mg | Freq: Once | ORAL | Status: DC | PRN

## 2021-10-19 SURGICAL SUPPLY — 49 items
APPLIER CLIP 5 13 M/L LIGAMAX5 (MISCELLANEOUS) ×3
BAG COUNTER SPONGE SURGICOUNT (BAG) ×3 IMPLANT
BLADE CLIPPER SURG (BLADE) IMPLANT
CANISTER SUCT 3000ML PPV (MISCELLANEOUS) ×3 IMPLANT
CHLORAPREP W/TINT 26 (MISCELLANEOUS) ×5 IMPLANT
CLIP APPLIE 5 13 M/L LIGAMAX5 (MISCELLANEOUS) ×2 IMPLANT
COVER MAYO STAND STRL (DRAPES) ×3 IMPLANT
COVER SURGICAL LIGHT HANDLE (MISCELLANEOUS) ×3 IMPLANT
DERMABOND ADVANCED (GAUZE/BANDAGES/DRESSINGS) ×1
DERMABOND ADVANCED .7 DNX12 (GAUZE/BANDAGES/DRESSINGS) ×2 IMPLANT
DRAPE C-ARM 42X120 X-RAY (DRAPES) ×3 IMPLANT
ELECT REM PT RETURN 9FT ADLT (ELECTROSURGICAL) ×3
ELECTRODE REM PT RTRN 9FT ADLT (ELECTROSURGICAL) ×2 IMPLANT
ENDOLOOP SUT PDS II  0 18 (SUTURE) ×6
ENDOLOOP SUT PDS II 0 18 (SUTURE) ×2 IMPLANT
GLOVE BIOGEL PI IND STRL 6 (GLOVE) ×2 IMPLANT
GLOVE BIOGEL PI INDICATOR 6 (GLOVE) ×1
GLOVE BIOGEL PI MICRO 5.5 (GLOVE) ×1
GLOVE BIOGEL PI MICRO STRL 5.5 (GLOVE) ×2 IMPLANT
GOWN STRL REUS W/ TWL LRG LVL3 (GOWN DISPOSABLE) ×6 IMPLANT
GOWN STRL REUS W/TWL LRG LVL3 (GOWN DISPOSABLE) ×9
KIT BASIN OR (CUSTOM PROCEDURE TRAY) ×3 IMPLANT
KIT TURNOVER KIT B (KITS) ×3 IMPLANT
L-HOOK LAP DISP 36CM (ELECTROSURGICAL) ×3
LHOOK LAP DISP 36CM (ELECTROSURGICAL) ×2 IMPLANT
NDL INSUFFLATION 14GA 120MM (NEEDLE) IMPLANT
NEEDLE INSUFFLATION 14GA 120MM (NEEDLE) IMPLANT
NS IRRIG 1000ML POUR BTL (IV SOLUTION) ×3 IMPLANT
PAD ARMBOARD 7.5X6 YLW CONV (MISCELLANEOUS) ×3 IMPLANT
PENCIL BUTTON HOLSTER BLD 10FT (ELECTRODE) ×3 IMPLANT
POUCH SPECIMEN RETRIEVAL 10MM (ENDOMECHANICALS) ×5 IMPLANT
SCISSORS LAP 5X35 DISP (ENDOMECHANICALS) ×3 IMPLANT
SET CHOLANGIOGRAPH 5 50 .035 (SET/KITS/TRAYS/PACK) ×3 IMPLANT
SET IRRIG TUBING LAPAROSCOPIC (IRRIGATION / IRRIGATOR) ×3 IMPLANT
SET TUBE SMOKE EVAC HIGH FLOW (TUBING) ×3 IMPLANT
SLEEVE ENDOPATH XCEL 5M (ENDOMECHANICALS) ×6 IMPLANT
SPECIMEN JAR SMALL (MISCELLANEOUS) ×3 IMPLANT
SUT MNCRL AB 4-0 PS2 18 (SUTURE) ×5 IMPLANT
SUT VIC AB 3-0 SH 27 (SUTURE)
SUT VIC AB 3-0 SH 27XBRD (SUTURE) IMPLANT
SUT VICRYL 0 UR6 27IN ABS (SUTURE) ×3 IMPLANT
TOWEL GREEN STERILE (TOWEL DISPOSABLE) ×3 IMPLANT
TOWEL GREEN STERILE FF (TOWEL DISPOSABLE) ×3 IMPLANT
TRAY LAPAROSCOPIC MC (CUSTOM PROCEDURE TRAY) ×3 IMPLANT
TROCAR XCEL 12X100 BLDLESS (ENDOMECHANICALS) IMPLANT
TROCAR XCEL BLUNT TIP 100MML (ENDOMECHANICALS) ×3 IMPLANT
TROCAR XCEL NON-BLD 5MMX100MML (ENDOMECHANICALS) ×3 IMPLANT
TROCAR Z THRD FIOS 12X150 (TROCAR) ×2 IMPLANT
WATER STERILE IRR 1000ML POUR (IV SOLUTION) ×1 IMPLANT

## 2021-10-19 NOTE — Op Note (Signed)
Date: 10/19/21  Patient: Jasmine Krause MRN: [id number]  Preoperative Diagnosis: Acute cholecystitis Postoperative Diagnosis: Same  Procedure: Laparoscopic cholecystectomy  Surgeon: Sophronia Simas, MD  EBL: 25 mL  Anesthesia: General endotracheal  Specimens: Gallbladder  Indications: Jasmine Krause is a 36 year old woman who presented with acute onset right upper quadrant abdominal pain.  Ultrasound showed signs of cholecystitis with multiple large obstructing stones.  She was also noted to have a significant leukocytosis.  Laparoscopic cholecystectomy was recommended and she agreed to proceed with surgery.  Findings: Purulent cholecystitis with multiple large obstructing stones in the neck of the gallbladder.  Procedure details: Informed consent was obtained in the preoperative area prior to the procedure. The patient was brought to the operating room and placed on the table in the supine position.  General anesthesia was induced and appropriate lines and drains were placed for intraoperative monitoring. Perioperative antibiotics were administered per SCIP guidelines. The abdomen was prepped and draped in the usual sterile fashion. A pre-procedure timeout was taken verifying patient identity, surgical site and procedure to be performed.  A small supraumbilical skin incision was made, the subcutaneous tissue was divided with cautery, and the umbilical stalk was grasped and elevated.  A Veress needle was inserted through the fascia, intraperitoneal placement was confirmed with the saline drop test, and the abdomen was insufflated.  A 12 mm port was placed and the peritoneal cavity was inspected with no evidence of visceral or vascular injury. Three 51mm ports were placed in the right subcostal margin, all under direct visualization.  There were omental adhesions to the gallbladder, which were taken down bluntly.  The gallbladder was very distended and edematous, consistent with acute cholecystitis.   Needle decompression of the gallbladder was performed to aid in retraction, and clear fluid was aspirated from the gallbladder.  The fundus of the gallbladder was grasped and retracted cephalad. The infundibulum was retracted laterally.  There was significant gallbladder wall thickening and inflammation in the cystic triangle.  The cystic triangle was dissected out using cautery and blunt dissection.  This was a tedious dissection due to the degree of inflammation.  The cystic artery was identified and circumferentially dissected out, and clipped very close to the gallbladder prior to division with cautery.  The cystic duct appeared dilated and was further cleared of surrounding inflammatory tissue using blunt dissection and cautery.  There was a stone lodged within the neck of the gallbladder which was milked up superiorly away from the cystic duct.  The cystic duct was too dilated to cross with a clip, thus it was divided using cautery very high up close to the gallbladder.  The lumen of the cystic duct stump was irrigated and there were no stones retained within the cystic duct stump.  The cystic duct stump was then closed with a PDS Endoloop.  The gallbladder was taken off the liver using cautery.  The posterior wall was very adherent to the liver, and thus the gallbladder was opened and a portion of the posterior wall was left in place on the liver.  There was purulent drainage within the gallbladder and multiple large obstructing stones.  The gallbladder and the stones were placed in an Endo Catch bag and removed.  This required extension of the umbilical fascial incision.  The posterior wall of the gallbladder on the gallbladder fossa was fulgurated with cautery and hemostasis was achieved.  The surgical site was irrigated with saline until the effluent was clear. The cystic duct and artery stumps were  visually inspected and there was no evidence of bile leak or bleeding. The ports were removed under direct  visualization and the abdomen was desufflated. The umbilical port site fascia was closed with 0 vicryl figure-of-eight sutures. The skin at all port sites was closed with 4-0 monocryl subcuticular suture. Dermabond was applied.  The patient tolerated the procedure well with no apparent complications.  All counts were correct x2 at the end of the procedure. The patient was extubated and taken to PACU in stable condition.  Sophronia Simas, MD 10/19/21 10:26 AM

## 2021-10-19 NOTE — Anesthesia Preprocedure Evaluation (Addendum)
Anesthesia Evaluation  Patient identified by MRN, date of birth, ID band Patient awake    Reviewed: Allergy & Precautions, NPO status , Patient's Chart, lab work & pertinent test results  History of Anesthesia Complications Negative for: history of anesthetic complications  Airway Mallampati: III  TM Distance: >3 FB Neck ROM: Full    Dental  (+) Teeth Intact, Dental Advisory Given   Pulmonary neg shortness of breath, neg sleep apnea, neg COPD, neg recent URI, former smoker,    breath sounds clear to auscultation       Cardiovascular negative cardio ROS   Rhythm:Regular     Neuro/Psych negative neurological ROS  negative psych ROS   GI/Hepatic negative GI ROS, Lab Results      Component                Value               Date                      ALT                      22                  10/19/2021                AST                      18                  10/19/2021                ALKPHOS                  70                  10/19/2021                BILITOT                  0.6                 10/19/2021             Cholecystitis   Endo/Other  diabetesMorbid obesityLab Results      Component                Value               Date                      HGBA1C                   10.5 (H)            10/19/2021             Renal/GU negative Renal ROSLab Results      Component                Value               Date                      CREATININE               0.70                10/19/2021  Musculoskeletal negative musculoskeletal ROS (+)   Abdominal   Peds  Hematology   Anesthesia Other Findings   Reproductive/Obstetrics Lab Results      Component                Value               Date                      PREGTESTUR               NEGATIVE            05/17/2011                HCG                      <5.0                10/18/2021                                        Anesthesia Physical Anesthesia Plan  ASA: 3  Anesthesia Plan: General   Post-op Pain Management: Toradol IV (intra-op)* and Ofirmev IV (intra-op)*   Induction:   PONV Risk Score and Plan: 3 and Ondansetron, Dexamethasone and Midazolam  Airway Management Planned: Oral ETT  Additional Equipment: None  Intra-op Plan:   Post-operative Plan: Extubation in OR  Informed Consent: I have reviewed the patients History and Physical, chart, labs and discussed the procedure including the risks, benefits and alternatives for the proposed anesthesia with the patient or authorized representative who has indicated his/her understanding and acceptance.     Dental advisory given  Plan Discussed with: CRNA  Anesthesia Plan Comments:        Anesthesia Quick Evaluation

## 2021-10-19 NOTE — Transfer of Care (Signed)
Immediate Anesthesia Transfer of Care Note  Patient: Jasmine Krause  Procedure(s) Performed: LAPAROSCOPIC CHOLECYSTECTOMY (Abdomen)  Patient Location: PACU  Anesthesia Type:General  Level of Consciousness: awake and alert   Airway & Oxygen Therapy: Patient Spontanous Breathing and Patient connected to nasal cannula oxygen  Post-op Assessment: Report given to RN and Post -op Vital signs reviewed and stable  Post vital signs: Reviewed and stable  Last Vitals:  Vitals Value Taken Time  BP 144/83 10/19/21 1037  Temp    Pulse 100 10/19/21 1039  Resp 24 10/19/21 1039  SpO2 96 % 10/19/21 1039  Vitals shown include unvalidated device data.  Last Pain:  Vitals:   10/19/21 0730  TempSrc:   PainSc: 3          Complications: No notable events documented.

## 2021-10-19 NOTE — Progress Notes (Signed)
Day of Surgery  Subjective: No acute changes. WBC downtrending, LFTs remain normal. Hgb A1c 10.   Objective: Vital signs in last 24 hours: Temp:  [98.4 F (36.9 C)-99.5 F (37.5 C)] 98.4 F (36.9 C) (07/04 0418) Pulse Rate:  [105-139] 109 (07/04 0418) Resp:  [15-22] 16 (07/04 0418) BP: (130-160)/(83-103) 156/102 (07/04 0418) SpO2:  [97 %-100 %] 97 % (07/04 0418) Weight:  [150.3 kg] 150.3 kg (07/04 0418)    Intake/Output from previous day: 07/03 0701 - 07/04 0700 In: 60.3 [IV Piggyback:60.3] Out: -  Intake/Output this shift: No intake/output data recorded.  PE: General: resting comfortably, NAD Neuro: alert and oriented, no focal deficits Resp: normal work of breathing Abdomen: soft, nondistended, RUQ tenderness Extremities: warm and well-perfused   Lab Results:  Recent Labs    10/18/21 1525 10/19/21 0624  WBC 21.3* 16.3*  HGB 13.7 12.7  HCT 43.9 37.7  PLT 362 308   BMET Recent Labs    10/18/21 1525 10/19/21 0624  NA 134* 134*  K 3.8 3.5  CL 102 102  CO2 21* 22  GLUCOSE 314* 264*  BUN 5* 5*  CREATININE 0.79 0.70  CALCIUM 9.3 9.1   PT/INR No results for input(s): "LABPROT", "INR" in the last 72 hours. CMP     Component Value Date/Time   NA 134 (L) 10/19/2021 0624   NA 138 06/17/2020 1000   K 3.5 10/19/2021 0624   CL 102 10/19/2021 0624   CO2 22 10/19/2021 0624   GLUCOSE 264 (H) 10/19/2021 0624   BUN 5 (L) 10/19/2021 0624   BUN 8 06/17/2020 1000   CREATININE 0.70 10/19/2021 0624   CALCIUM 9.1 10/19/2021 0624   PROT 7.0 10/19/2021 0624   PROT 7.1 06/17/2020 1000   ALBUMIN 3.0 (L) 10/19/2021 0624   ALBUMIN 4.4 06/17/2020 1000   AST 18 10/19/2021 0624   ALT 22 10/19/2021 0624   ALKPHOS 70 10/19/2021 0624   BILITOT 0.6 10/19/2021 0624   BILITOT <0.2 06/17/2020 1000   GFRNONAA >60 10/19/2021 0624   GFRAA >90 10/02/2011 1607   Lipase     Component Value Date/Time   LIPASE 31 10/18/2021 1525       Studies/Results: US Abdomen  Limited RUQ (LIVER/GB)  Result Date: 10/18/2021 CLINICAL DATA:  Right upper quadrant pain EXAM: ULTRASOUND ABDOMEN LIMITED RIGHT UPPER QUADRANT COMPARISON:  None Available. FINDINGS: Gallbladder: Gallbladder is well distended with multiple gallstones. A stone is noted in the gallbladder neck which is non mobile. Mild gallbladder wall thickening is noted. Gallbladder sludge is seen as well. Common bile duct: Diameter: 4.9 mm. Liver: Diffuse increased echogenicity is noted consistent with fatty infiltration. No focal mass is noted. Portal vein is patent on color Doppler imaging with normal direction of blood flow towards the liver. Other: None. IMPRESSION: Fatty liver. Cholelithiasis with evidence of a single stone lodged in the gallbladder neck which is non mobile. Gallbladder sludge and wall thickening is noted as well. These changes are consistent with calculous cholecystitis in the appropriate clinical setting. Electronically Signed   By: Alcide Clever M.D.   On: 10/18/2021 22:10   CT ABDOMEN PELVIS W CONTRAST  Result Date: 10/18/2021 CLINICAL DATA:  Nausea vomiting with right lower quadrant pain. EXAM: CT ABDOMEN AND PELVIS WITH CONTRAST TECHNIQUE: Multidetector CT imaging of the abdomen and pelvis was performed using the standard protocol following bolus administration of intravenous contrast. RADIATION DOSE REDUCTION: This exam was performed according to the departmental dose-optimization program which includes automated exposure  control, adjustment of the mA and/or kV according to patient size and/or use of iterative reconstruction technique. CONTRAST:  OMNIPAQUE IOHEXOL 300 MG/ML  SOLN COMPARISON:  None Available. FINDINGS: Lower chest: Unremarkable. Hepatobiliary: The liver shows diffusely decreased attenuation suggesting fat deposition. Calcified gallstones identified measuring up to approximately 2.6 cm diameter. Diffuse gallbladder wall thickening evident. No intrahepatic or extrahepatic biliary  dilation. Pancreas: No focal mass lesion. No dilatation of the main duct. No intraparenchymal cyst. No peripancreatic edema. Spleen: No splenomegaly. No focal mass lesion. Adrenals/Urinary Tract: No adrenal nodule or mass. Kidneys unremarkable. No evidence for hydroureter. The urinary bladder appears normal for the degree of distention. Stomach/Bowel: Stomach is unremarkable. No gastric wall thickening. No evidence of outlet obstruction. Duodenum is normally positioned as is the ligament of Treitz. No small bowel wall thickening. No small bowel dilatation. The terminal ileum is normal. The appendix is normal. No gross colonic mass. No colonic wall thickening. Vascular/Lymphatic: No abdominal aortic aneurysm. No abdominal aortic atherosclerotic calcification. There is no gastrohepatic or hepatoduodenal ligament lymphadenopathy. No retroperitoneal or mesenteric lymphadenopathy. Prominent portal caval lymph node is likely reactive. No pelvic sidewall lymphadenopathy. Reproductive: The uterus is unremarkable.  There is no adnexal mass. Other: Trace free fluid noted in the right adnexal space posteriorly. Musculoskeletal: No worrisome lytic or sclerotic osseous abnormality. IMPRESSION: 1. Cholelithiasis with diffuse gallbladder wall thickening. Acute cholecystitis not excluded. Right upper quadrant ultrasound may prove helpful to further evaluate. 2. Normal appendix and terminal ileum. 3. Trace free fluid in the right adnexal space posteriorly, nonspecific but likely physiologic. 4. Hepatic steatosis. Electronically Signed   By: Kennith Center M.D.   On: 10/18/2021 21:07    Anti-infectives: Anti-infectives (From admission, onward)    Start     Dose/Rate Route Frequency Ordered Stop   10/19/21 0630  piperacillin-tazobactam (ZOSYN) IVPB 3.375 g        3.375 g 12.5 mL/hr over 240 Minutes Intravenous Every 8 hours 10/19/21 0414 10/26/21 0559   10/18/21 2230  piperacillin-tazobactam (ZOSYN) IVPB 3.375 g        3.375  g 100 mL/hr over 30 Minutes Intravenous  Once 10/18/21 2221 10/18/21 2338        Assessment/Plan  36 yo female with acute calculous cholecystitis. - Proceed to OR today for lap cholecystectomy. Procedure details were discussed with the patient and informed consent obtained. Risks reviewed including bleeding, infection, and <0.5% risk of bile duct injury. - On IV antibiotics - Discussed with patient that her A1c indicates she is diabetic. Will place consult to inpatient diabetes coordinator, patient will also need close follow up with PCP. Continue SSI, resume metformin when taking PO postop. - VTE: SCDs     LOS: 0 days    Sophronia Simas, MD [facility] Surgery General, Hepatobiliary and Pancreatic Surgery 10/19/21 8:16 AM

## 2021-10-19 NOTE — Plan of Care (Signed)
  Problem: Coping: Goal: Level of anxiety will decrease Outcome: Progressing   Problem: Pain Managment: Goal: General experience of comfort will improve Outcome: Progressing   Problem: Safety: Goal: Ability to remain free from injury will improve Outcome: Progressing   Problem: Skin Integrity: Goal: Risk for impaired skin integrity will decrease Outcome: Progressing   

## 2021-10-19 NOTE — Plan of Care (Signed)

## 2021-10-19 NOTE — Anesthesia Procedure Notes (Signed)
Procedure Name: Intubation Date/Time: 10/19/2021 8:26 AM  Performed by: Wilburn Cornelia, CRNAPre-anesthesia Checklist: Emergency Drugs available, Patient identified, Suction available, Patient being monitored and Timeout performed Patient Re-evaluated:Patient Re-evaluated prior to induction Oxygen Delivery Method: Circle system utilized Preoxygenation: Pre-oxygenation with 100% oxygen Induction Type: IV induction Ventilation: Mask ventilation without difficulty Laryngoscope Size: Mac and 3 Grade View: Grade II Tube type: Oral Tube size: 7.0 mm Number of attempts: 1 Airway Equipment and Method: Stylet Placement Confirmation: ETT inserted through vocal cords under direct vision, positive ETCO2, CO2 detector and breath sounds checked- equal and bilateral Secured at: 23 cm Tube secured with: Tape Dental Injury: Teeth and Oropharynx as per pre-operative assessment

## 2021-10-19 NOTE — Plan of Care (Signed)

## 2021-10-20 ENCOUNTER — Encounter (HOSPITAL_COMMUNITY): Payer: Self-pay | Admitting: Surgery

## 2021-10-20 LAB — GLUCOSE, CAPILLARY
Glucose-Capillary: 238 mg/dL — ABNORMAL HIGH (ref 70–99)
Glucose-Capillary: 250 mg/dL — ABNORMAL HIGH (ref 70–99)
Glucose-Capillary: 272 mg/dL — ABNORMAL HIGH (ref 70–99)
Glucose-Capillary: 340 mg/dL — ABNORMAL HIGH (ref 70–99)
Glucose-Capillary: 299 mg/dL — ABNORMAL HIGH (ref 70–99)

## 2021-10-20 LAB — POC URINE PREG, ED: Preg Test, Ur: NEGATIVE

## 2021-10-20 MED ORDER — IBUPROFEN 200 MG PO TABS
800.0000 mg | ORAL_TABLET | Freq: Three times a day (TID) | ORAL | Status: DC
  Administered 2021-10-20 – 2021-10-21 (×4): 800 mg via ORAL
  Filled 2021-10-20 (×4): qty 4

## 2021-10-20 MED ORDER — MUPIROCIN 2 % EX OINT
1.0000 | TOPICAL_OINTMENT | Freq: Two times a day (BID) | CUTANEOUS | Status: DC
  Administered 2021-10-20 – 2021-10-21 (×3): 1 via NASAL
  Filled 2021-10-20: qty 22

## 2021-10-20 MED ORDER — INSULIN ASPART 100 UNIT/ML IJ SOLN
0.0000 [IU] | Freq: Three times a day (TID) | INTRAMUSCULAR | Status: DC
  Administered 2021-10-20: 11 [IU] via SUBCUTANEOUS
  Administered 2021-10-21 (×2): 8 [IU] via SUBCUTANEOUS

## 2021-10-20 MED ORDER — INSULIN GLARGINE-YFGN 100 UNIT/ML ~~LOC~~ SOLN
30.0000 [IU] | Freq: Every day | SUBCUTANEOUS | Status: DC
Start: 2021-10-20 — End: 2021-10-21
  Administered 2021-10-20: 30 [IU] via SUBCUTANEOUS
  Filled 2021-10-20 (×2): qty 0.3

## 2021-10-20 MED ORDER — ACETAMINOPHEN 500 MG PO TABS
1000.0000 mg | ORAL_TABLET | Freq: Four times a day (QID) | ORAL | Status: DC
  Administered 2021-10-20 – 2021-10-21 (×4): 1000 mg via ORAL
  Filled 2021-10-20 (×5): qty 2

## 2021-10-20 MED ORDER — LIVING WELL WITH DIABETES BOOK
Freq: Once | Status: AC
Start: 2021-10-20 — End: 2021-10-20
  Filled 2021-10-20: qty 1

## 2021-10-20 MED ORDER — SODIUM CHLORIDE 0.9% FLUSH: 3.0000 mL | INTRAVENOUS | Status: DC | PRN

## 2021-10-20 MED ORDER — SODIUM CHLORIDE 0.9% FLUSH
3.0000 mL | Freq: Two times a day (BID) | INTRAVENOUS | Status: DC
  Administered 2021-10-20 – 2021-10-21 (×3): 3 mL via INTRAVENOUS

## 2021-10-20 MED ORDER — CHLORHEXIDINE GLUCONATE CLOTH 2 % EX PADS
6.0000 | MEDICATED_PAD | Freq: Every day | CUTANEOUS | Status: DC
  Administered 2021-10-20 – 2021-10-21 (×2): 6 via TOPICAL

## 2021-10-20 MED ORDER — INSULIN STARTER KIT- PEN NEEDLES (ENGLISH)
1.0000 | Freq: Once | Status: AC
Start: 2021-10-20 — End: 2021-10-20
  Administered 2021-10-20: 1
  Filled 2021-10-20 (×2): qty 1

## 2021-10-20 MED ORDER — SODIUM CHLORIDE 0.9 % IV SOLN: 250.0000 mL | INTRAVENOUS | Status: DC | PRN

## 2021-10-20 NOTE — Progress Notes (Signed)
Progress Note  1 Day Post-Op  Subjective: Pt reports some nausea but tolerating some PO. She has been getting up to the restroom but has not walked much otherwise. We discussed trying to mobilize more today. Having some issues with pain control and feeling like it hurts to take a deep breath. Passing some flatus.   Objective: Vital signs in last 24 hours: Temp:  [98 F (36.7 C)-99.5 F (37.5 C)] 99.5 F (37.5 C) (07/05 0742) Pulse Rate:  [90-110] 90 (07/05 0742) Resp:  [16-18] 18 (07/05 0742) BP: (131-186)/(88-109) 174/109 (07/05 0742) SpO2:  [96 %-100 %] 96 % (07/05 0742) Last BM Date : 10/18/21  Intake/Output from previous day: 07/04 0701 - 07/05 0700 In: 1940 [P.O.:840; I.V.:1100] Out: 175 [Urine:150; Blood:25] Intake/Output this shift: No intake/output data recorded.  PE: General: pleasant, WD, obese female who is laying in bed in NAD HEENT: sclera anicteric  Heart: regular, rate, and rhythm.  Lungs: CTAB, no wheezes, rhonchi, or rales noted.  Respiratory effort nonlabored Abd: soft, appropriately ttp, ND, +BS, incisions C/D/I Psych: A&Ox3 with an appropriate affect.    Lab Results:  Recent Labs    10/18/21 1525 10/19/21 0624  WBC 21.3* 16.3*  HGB 13.7 12.7  HCT 43.9 37.7  PLT 362 308   BMET Recent Labs    10/18/21 1525 10/19/21 0624  NA 134* 134*  K 3.8 3.5  CL 102 102  CO2 21* 22  GLUCOSE 314* 264*  BUN 5* 5*  CREATININE 0.79 0.70  CALCIUM 9.3 9.1   PT/INR No results for input(s): "LABPROT", "INR" in the last 72 hours. CMP     Component Value Date/Time   NA 134 (L) 10/19/2021 0624   NA 138 06/17/2020 1000   K 3.5 10/19/2021 0624   CL 102 10/19/2021 0624   CO2 22 10/19/2021 0624   GLUCOSE 264 (H) 10/19/2021 0624   BUN 5 (L) 10/19/2021 0624   BUN 8 06/17/2020 1000   CREATININE 0.70 10/19/2021 0624   CALCIUM 9.1 10/19/2021 0624   PROT 7.0 10/19/2021 0624   PROT 7.1 06/17/2020 1000   ALBUMIN 3.0 (L) 10/19/2021 0624   ALBUMIN 4.4  06/17/2020 1000   AST 18 10/19/2021 0624   ALT 22 10/19/2021 0624   ALKPHOS 70 10/19/2021 0624   BILITOT 0.6 10/19/2021 0624   BILITOT <0.2 06/17/2020 1000   GFRNONAA >60 10/19/2021 0624   GFRAA >90 10/02/2011 1607   Lipase     Component Value Date/Time   LIPASE 31 10/18/2021 1525       Studies/Results: US Abdomen Limited RUQ (LIVER/GB)  Result Date: 10/18/2021 CLINICAL DATA:  Right upper quadrant pain EXAM: ULTRASOUND ABDOMEN LIMITED RIGHT UPPER QUADRANT COMPARISON:  None Available. FINDINGS: Gallbladder: Gallbladder is well distended with multiple gallstones. A stone is noted in the gallbladder neck which is non mobile. Mild gallbladder wall thickening is noted. Gallbladder sludge is seen as well. Common bile duct: Diameter: 4.9 mm. Liver: Diffuse increased echogenicity is noted consistent with fatty infiltration. No focal mass is noted. Portal vein is patent on color Doppler imaging with normal direction of blood flow towards the liver. Other: None. IMPRESSION: Fatty liver. Cholelithiasis with evidence of a single stone lodged in the gallbladder neck which is non mobile. Gallbladder sludge and wall thickening is noted as well. These changes are consistent with calculous cholecystitis in the appropriate clinical setting. Electronically Signed   By: Alcide Clever M.D.   On: 10/18/2021 22:10   CT ABDOMEN PELVIS W CONTRAST  Result Date: 10/18/2021 CLINICAL DATA:  Nausea vomiting with right lower quadrant pain. EXAM: CT ABDOMEN AND PELVIS WITH CONTRAST TECHNIQUE: Multidetector CT imaging of the abdomen and pelvis was performed using the standard protocol following bolus administration of intravenous contrast. RADIATION DOSE REDUCTION: This exam was performed according to the departmental dose-optimization program which includes automated exposure control, adjustment of the mA and/or kV according to patient size and/or use of iterative reconstruction technique. CONTRAST:  OMNIPAQUE IOHEXOL  300 MG/ML  SOLN COMPARISON:  None Available. FINDINGS: Lower chest: Unremarkable. Hepatobiliary: The liver shows diffusely decreased attenuation suggesting fat deposition. Calcified gallstones identified measuring up to approximately 2.6 cm diameter. Diffuse gallbladder wall thickening evident. No intrahepatic or extrahepatic biliary dilation. Pancreas: No focal mass lesion. No dilatation of the main duct. No intraparenchymal cyst. No peripancreatic edema. Spleen: No splenomegaly. No focal mass lesion. Adrenals/Urinary Tract: No adrenal nodule or mass. Kidneys unremarkable. No evidence for hydroureter. The urinary bladder appears normal for the degree of distention. Stomach/Bowel: Stomach is unremarkable. No gastric wall thickening. No evidence of outlet obstruction. Duodenum is normally positioned as is the ligament of Treitz. No small bowel wall thickening. No small bowel dilatation. The terminal ileum is normal. The appendix is normal. No gross colonic mass. No colonic wall thickening. Vascular/Lymphatic: No abdominal aortic aneurysm. No abdominal aortic atherosclerotic calcification. There is no gastrohepatic or hepatoduodenal ligament lymphadenopathy. No retroperitoneal or mesenteric lymphadenopathy. Prominent portal caval lymph node is likely reactive. No pelvic sidewall lymphadenopathy. Reproductive: The uterus is unremarkable.  There is no adnexal mass. Other: Trace free fluid noted in the right adnexal space posteriorly. Musculoskeletal: No worrisome lytic or sclerotic osseous abnormality. IMPRESSION: 1. Cholelithiasis with diffuse gallbladder wall thickening. Acute cholecystitis not excluded. Right upper quadrant ultrasound may prove helpful to further evaluate. 2. Normal appendix and terminal ileum. 3. Trace free fluid in the right adnexal space posteriorly, nonspecific but likely physiologic. 4. Hepatic steatosis. Electronically Signed   By: Kennith Center M.D.   On: 10/18/2021 21:07     Anti-infectives: Anti-infectives (From admission, onward)    Start     Dose/Rate Route Frequency Ordered Stop   10/19/21 0630  piperacillin-tazobactam (ZOSYN) IVPB 3.375 g        3.375 g 12.5 mL/hr over 240 Minutes Intravenous Every 8 hours 10/19/21 0414 10/20/21 0806   10/18/21 2230  piperacillin-tazobactam (ZOSYN) IVPB 3.375 g        3.375 g 100 mL/hr over 30 Minutes Intravenous  Once 10/18/21 2221 10/18/21 2338        Assessment/Plan POD1 s/p laparoscopic cholecystectomy for acute cholecystitis  - some nausea but passing some flatus - encourage ambulation  - scheduled tylenol and motrin for pain control  - not quite ready for DC today, likely in AM if pain control and nausea improved  T2DM - A1c 10, diabetes coordinator consulted, will need to figure out if DC on PO meds vs SSI - will need PCP f/u - discussed this with patient   FEN: CM diet, SLIV VTE: LMWH ID: Zosyn 7/3>7/5  LOS: 0 days   I reviewed last 24 h vitals and pain scores, last 48 h intake and output, last 24 h labs and trends, and diabetes RN notes .     Juliet Rude, [facility] 10/20/2021, 11:44 AM Please see Amion for pager number during day hours 7:00am-4:30pm

## 2021-10-20 NOTE — Progress Notes (Signed)
Mobility Specialist Progress Note   10/20/21 1728  Mobility  Activity Refused mobility   Pt deferring to mobilize and requesting to eat instead. Will f/u tomorrow if time permits.   Frederico Hamman Mobility Specialist MS [facility] #:  [phone] Acute Rehab Office:  [phone]

## 2021-10-20 NOTE — Inpatient Diabetes Management (Addendum)
Inpatient Diabetes Program Recommendations  AACE/ADA: New Consensus Statement on Inpatient Glycemic Control (2015)  Target Ranges:  Prepandial:   less than 140 mg/dL      Peak postprandial:   less than 180 mg/dL (1-2 hours)      Critically ill patients:  140 - 180 mg/dL   Lab Results  Component Value Date   GLUCAP 250 (H) 10/20/2021   HGBA1C 10.5 (H) 10/19/2021    Review of Glycemic Control  Diabetes history: No prior hx DM Current orders for Inpatient glycemic control: Novolog 0-15 units q 4 hrs.  Inpatient Diabetes Program Recommendations:   Please consider: -Add Semglee 30 units qd (0.2 units/kg x 150.3 kg) -Change Novolog 0-15 units to tid + hs 0-5 units  Ordered Living Well With Diabetes booklet, dietician consult, and insulin pen starter kit.  Nurses, please teach patient insulin administration and allow to give own insulin to prepare if discharged home on insulin.  12:30 Spoke with pt about new diagnosis. Discussed A1C results 10.5 (average blood glucose 255 over the past 2-3 months) and explained what an A1C is, basic pathophysiology of DM Type 2, basic home care, basic diabetes diet nutrition principles, importance of checking CBGs and maintaining good CBG control to prevent long-term and short-term complications. Reviewed signs and symptoms of hyperglycemia and hypoglycemia and how to treat hypoglycemia at home. Also reviewed blood sugar goals at home.  Patient states she was told before she had pre-diabetes. Reviewed need for followup with a PCP and  acknowledges understanding.  Limited teaching time due to patient having pain due to surgical pain and nurse bringing pain medication.  Thank you, Nani Gasser. Madilynn Montante, RN, MSN, CDE  Diabetes Coordinator Inpatient Glycemic Control Team Team Pager [phone] (8am-5pm) 10/20/2021 11:22 AM

## 2021-10-20 NOTE — Plan of Care (Signed)

## 2021-10-21 ENCOUNTER — Other Ambulatory Visit (HOSPITAL_COMMUNITY): Payer: Self-pay

## 2021-10-21 LAB — GLUCOSE, CAPILLARY
Glucose-Capillary: 283 mg/dL — ABNORMAL HIGH (ref 70–99)
Glucose-Capillary: 291 mg/dL — ABNORMAL HIGH (ref 70–99)

## 2021-10-21 LAB — SURGICAL PATHOLOGY

## 2021-10-21 MED ORDER — ACETAMINOPHEN 500 MG PO TABS: 1000.0000 mg | ORAL_TABLET | Freq: Four times a day (QID) | ORAL | Status: DC | PRN

## 2021-10-21 MED ORDER — ACCU-CHEK SOFTCLIX LANCETS MISC
0 refills | Status: AC
  Filled 2021-10-21: qty 100, 25d supply, fill #0

## 2021-10-21 MED ORDER — GLUCOSE BLOOD VI STRP
ORAL_STRIP | 0 refills | Status: DC
  Filled 2021-10-21: qty 100, 25d supply, fill #0

## 2021-10-21 MED ORDER — LANTUS SOLOSTAR 100 UNIT/ML ~~LOC~~ SOPN
30.0000 [IU] | PEN_INJECTOR | Freq: Every day | SUBCUTANEOUS | 0 refills | Status: DC
  Filled 2021-10-21: qty 9, 30d supply, fill #0

## 2021-10-21 MED ORDER — MUPIROCIN 2 % EX OINT: 1.0000 | TOPICAL_OINTMENT | Freq: Two times a day (BID) | CUTANEOUS | 0 refills | Status: DC

## 2021-10-21 MED ORDER — INSULIN PEN NEEDLE 32G X 4 MM MISC
1.0000 | Freq: Every day | 0 refills | Status: DC
  Filled 2021-10-21: qty 100, 25d supply, fill #0

## 2021-10-21 MED ORDER — SIMETHICONE 80 MG PO CHEW: 80.0000 mg | CHEWABLE_TABLET | Freq: Four times a day (QID) | ORAL | Status: DC | PRN

## 2021-10-21 MED ORDER — IBUPROFEN 800 MG PO TABS
800.0000 mg | ORAL_TABLET | Freq: Three times a day (TID) | ORAL | 0 refills | Status: DC | PRN
Start: 2021-10-21 — End: 2022-05-12
  Filled 2021-10-21: qty 30, 10d supply, fill #0

## 2021-10-21 MED ORDER — BLOOD GLUCOSE METER KIT
PACK | 0 refills | Status: AC
  Filled 2021-10-21: qty 1, 30d supply, fill #0

## 2021-10-21 MED ORDER — METFORMIN HCL 500 MG PO TABS
500.0000 mg | ORAL_TABLET | Freq: Two times a day (BID) | ORAL | 0 refills | Status: DC
  Filled 2021-10-21: qty 60, 30d supply, fill #0

## 2021-10-21 MED ORDER — OXYCODONE HCL 5 MG PO TABS
5.0000 mg | ORAL_TABLET | Freq: Four times a day (QID) | ORAL | 0 refills | Status: DC | PRN
  Filled 2021-10-21: qty 20, 5d supply, fill #0

## 2021-10-21 NOTE — Progress Notes (Signed)
Mobility Specialist Progress Note   10/21/21 1000  Mobility  Activity Ambulated independently in hallway  Level of Assistance Independent  Assistive Device None  Distance Ambulated (ft) 116 ft  Activity Response Tolerated well  $Mobility charge 1 Mobility   Pre Mobility: 101 HR, 94% SpO2 During Mobility: 120 HR, 97% SpO2 Post Mobility: 108 HR, 96% SpO2  Received pt in bed having stating back is tight but agreeable to mobility. Pt expressing no pain during ambulation but HR was slightly elevated, in all returned to room w/o fault. Left in BR w/ no further needs.  Frederico Hamman Mobility Specialist MS [facility] #:  [phone] Acute Rehab Office:  [phone]

## 2021-10-21 NOTE — Inpatient Diabetes Management (Addendum)
Inpatient Diabetes Program Recommendations  AACE/ADA: New Consensus Statement on Inpatient Glycemic Control (2015)  Target Ranges:  Prepandial:   less than 140 mg/dL      Peak postprandial:   less than 180 mg/dL (1-2 hours)      Critically ill patients:  140 - 180 mg/dL   Lab Results  Component Value Date   GLUCAP 283 (H) 10/21/2021   HGBA1C 10.5 (H) 10/19/2021    Review of Glycemic Control  Diabetes history: No prior hx DM Current orders for Inpatient glycemic control: Semglee 30 units q hs, Novolog 0-15 units tid  Inpatient Diabetes Program Recommendations:   Patient planning to be discharged today. Please consider on discharge: -Lantus solostar insulin pen # [id number] (30 units qd) -Add Metformin 500 mg bid -Insulin pen needles  #[id number] -CBG meter & supplies [id number] -Libre 2 sensor [id number] (2 per month)  May benefit on adding a GLP1 when follows up with her PCP.  Educated patient on insulin pen use at home. Reviewed contents of insulin flexpen starter kit. Reviewed all steps if insulin pen including attachment of needle, 2-unit air shot, dialing up dose, giving injection, removing needle, disposal of sharps, storage of unused insulin, disposal of insulin etc. Patient able to provide successful return demonstration. Also reviewed troubleshooting with insulin pen. MD to give patient Rxs for insulin pens and insulin pen needles.   MD ordered application of Freestyle CGM at discharge for patient. Education done regarding application and changing CGM sensor (alternate every 14 days on back of arms), 1 hour warm-up, use of glucometer when alert displays, how to scan CGM for glucose reading and information for PCP. Patient has also been given educational packet regarding use CGM sensor including the 1-800 toll free number for any questions, problems or needs related to the [facility] sensors or reader.    Sensor applied by patient to (L) Arm at 1100a.  Explained that glucose readings  will not be available until 1 hour after application. Reviewed use of CGM including how to scan, changing Sensor, Vitamin C warning, arrows with glucose readings, and Freestyle app. Gave patient Freestyle reader as well due to the app not being compatible with her phone.  Patient very appreciative.   Thank you, Nani Gasser. Haroon Shatto, RN, MSN, CDE  Diabetes Coordinator Inpatient Glycemic Control Team Team Pager [phone] (8am-5pm) 10/21/2021 8:55 AM

## 2021-10-21 NOTE — Discharge Instructions (Signed)
[facility] LAPAROSCOPIC SURGERY: POST OP INSTRUCTIONS Always review your discharge instruction sheet given to you by the facility where your surgery was performed. IF YOU HAVE DISABILITY OR FAMILY LEAVE FORMS, YOU MUST BRING THEM TO THE OFFICE FOR PROCESSING.   DO NOT GIVE THEM TO YOUR DOCTOR.  PAIN CONTROL  First take acetaminophen (Tylenol) AND/or ibuprofen (Advil) to control your pain after surgery.  Follow directions on package.  Taking acetaminophen (Tylenol) and/or ibuprofen (Advil) regularly after surgery will help to control your pain and lower the amount of prescription pain medication you may need.  You should not take more than 3,000 mg (3 grams) of acetaminophen (Tylenol) in 24 hours.  You should not take ibuprofen (Advil), aleve, motrin, naprosyn or other NSAIDS if you have a history of stomach ulcers or chronic kidney disease.  A prescription for pain medication may be given to you upon discharge.  Take your pain medication as prescribed, if you still have uncontrolled pain after taking acetaminophen (Tylenol) or ibuprofen (Advil). Use ice packs to help control pain. If you need a refill on your pain medication, please contact your pharmacy.  They will contact our office to request authorization. Prescriptions will not be filled after 5pm or on week-ends.  HOME MEDICATIONS Take your usually prescribed medications unless otherwise directed.  DIET You should follow a light diet the first few days after arrival home.  Be sure to include lots of fluids daily. Avoid fatty, fried foods.   CONSTIPATION It is common to experience some constipation after surgery and if you are taking pain medication.  Increasing fluid intake and taking a stool softener (such as Colace) will usually help or prevent this problem from occurring.  A mild laxative (Milk of Magnesia or Miralax) should be taken according to package instructions if there are no bowel movements after 48  hours.  WOUND/INCISION CARE Most patients will experience some swelling and bruising in the area of the incisions.  Ice packs will help.  Swelling and bruising can take several days to resolve.  Unless discharge instructions indicate otherwise, follow guidelines below  STERI-STRIPS - you may remove your outer bandages 48 hours after surgery, and you may shower at that time.  You have steri-strips (small skin tapes) in place directly over the incision.  These strips should be left on the skin for 7-10 days.   DERMABOND/SKIN GLUE - you may shower in 24 hours.  The glue will flake off over the next 2-3 weeks. Any sutures or staples will be removed at the office during your follow-up visit.  ACTIVITIES You may resume regular (light) daily activities beginning the next day--such as daily self-care, walking, climbing stairs--gradually increasing activities as tolerated.  You may have sexual intercourse when it is comfortable.  Refrain from any heavy lifting or straining until approved by your doctor. You may drive when you are no longer taking prescription pain medication, you can comfortably wear a seatbelt, and you can safely maneuver your car and apply brakes.  FOLLOW-UP You should see your doctor in the office for a follow-up appointment approximately 2-3 weeks after your surgery.  You should have been given your post-op/follow-up appointment when your surgery was scheduled.  If you did not receive a post-op/follow-up appointment, make sure that you call for this appointment within a day or two after you arrive home to insure a convenient appointment time.   WHEN TO CALL YOUR DOCTOR: Fever over 101.0 Inability to urinate Continued bleeding from incision.   Increased pain, redness, or drainage from the incision. Increasing abdominal pain  The clinic staff is available to answer your questions during regular business hours.  Please don't hesitate to call and ask to speak to one of the nurses for  clinical concerns.  If you have a medical emergency, go to the nearest emergency room or call 911.  A surgeon from [facility] is always on call at the hospital. [address], [address], [place], [place]  [address] ? [address] [address], [place], [place]   [address] [phone] ? [phone] ? FAX [phone] Web site: [email or web address]      Managing Your Pain After Surgery Without Opioids    Thank you for participating in our program to help patients manage their pain after surgery without opioids. This is part of our effort to provide you with the best care possible, without exposing you or your family to the risk that opioids pose.  What pain can I expect after surgery? You can expect to have some pain after surgery. This is normal. The pain is typically worse the day after surgery, and quickly begins to get better. Many studies have found that many patients are able to manage their pain after surgery with Over-the-Counter (OTC) medications such as Tylenol and Motrin. If you have a condition that does not allow you to take Tylenol or Motrin, notify your surgical team.  How will I manage my pain? The best strategy for controlling your pain after surgery is around the clock pain control with Tylenol (acetaminophen) and Motrin (ibuprofen or Advil). Alternating these medications with each other allows you to maximize your pain control. In addition to Tylenol and Motrin, you can use heating pads or ice packs on your incisions to help reduce your pain.  How will I alternate your regular strength over-the-counter pain medication? You will take a dose of pain medication every three hours. Start by taking 650 mg of Tylenol (2 pills of 325 mg) 3 hours later take 600 mg of Motrin (3 pills of 200 mg) 3 hours after taking the Motrin take 650 mg of Tylenol 3 hours after that take 600 mg of Motrin.   - 1 -  See example - if your first dose of Tylenol is at 12:00  PM   12:00 PM Tylenol 650 mg (2 pills of 325 mg)  3:00 PM Motrin 600 mg (3 pills of 200 mg)  6:00 PM Tylenol 650 mg (2 pills of 325 mg)  9:00 PM Motrin 600 mg (3 pills of 200 mg)  Continue alternating every 3 hours   We recommend that you follow this schedule around-the-clock for at least 3 days after surgery, or until you feel that it is no longer needed. Use the table on the last page of this handout to keep track of the medications you are taking. Important: Do not take more than 3000mg of Tylenol or 3200mg of Motrin in a 24-hour period. Do not take ibuprofen/Motrin if you have a history of bleeding stomach ulcers, severe kidney disease, &/or actively taking a blood thinner  What if I still have pain? If you have pain that is not controlled with the over-the-counter pain medications (Tylenol and Motrin or Advil) you might have what we call "breakthrough" pain. You will receive a prescription for a small amount of an opioid pain medication such as Oxycodone, Tramadol, or Tylenol with Codeine. Use these opioid pills in the first 24 hours after surgery if you have breakthrough pain. Do   not take more than 1 pill every 4-6 hours.  If you still have uncontrolled pain after using all opioid pills, don't hesitate to call our staff using the number provided. We will help make sure you are managing your pain in the best way possible, and if necessary, we can provide a prescription for additional pain medication.   Day 1    Time  Name of Medication Number of pills taken  Amount of Acetaminophen  Pain Level   Comments  AM PM       AM PM       AM PM       AM PM       AM PM       AM PM       AM PM       AM PM       Total Daily amount of Acetaminophen Do not take more than  3,000 mg per day      Day 2    Time  Name of Medication Number of pills taken  Amount of Acetaminophen  Pain Level   Comments  AM PM       AM PM       AM PM       AM PM       AM PM       AM PM       AM  PM       AM PM       Total Daily amount of Acetaminophen Do not take more than  3,000 mg per day      Day 3    Time  Name of Medication Number of pills taken  Amount of Acetaminophen  Pain Level   Comments  AM PM       AM PM       AM PM       AM PM          AM PM       AM PM       AM PM       AM PM       Total Daily amount of Acetaminophen Do not take more than  3,000 mg per day      Day 4    Time  Name of Medication Number of pills taken  Amount of Acetaminophen  Pain Level   Comments  AM PM       AM PM       AM PM       AM PM       AM PM       AM PM       AM PM       AM PM       Total Daily amount of Acetaminophen Do not take more than  3,000 mg per day      Day 5    Time  Name of Medication Number of pills taken  Amount of Acetaminophen  Pain Level   Comments  AM PM       AM PM       AM PM       AM PM       AM PM       AM PM       AM PM       AM PM       Total Daily amount of Acetaminophen Do not take more than    3,000 mg per day       Day 6    Time  Name of Medication Number of pills taken  Amount of Acetaminophen  Pain Level  Comments  AM PM       AM PM       AM PM       AM PM       AM PM       AM PM       AM PM       AM PM       Total Daily amount of Acetaminophen Do not take more than  3,000 mg per day      Day 7    Time  Name of Medication Number of pills taken  Amount of Acetaminophen  Pain Level   Comments  AM PM       AM PM       AM PM       AM PM       AM PM       AM PM       AM PM       AM PM       Total Daily amount of Acetaminophen Do not take more than  3,000 mg per day        For additional information about how and where to safely dispose of unused opioid medications - https://www.morepowerfulnc.org  Disclaimer: This document contains information and/or instructional materials adapted from [facility] for the typical patient with your condition. It does not replace medical advice  from your health care provider because your experience may differ from that of the typical patient. Talk to your health care provider if you have any questions about this document, your condition or your treatment plan. Adapted from [facility]  

## 2021-10-21 NOTE — Plan of Care (Signed)
  RD consulted for nutrition education regarding diabetes.   Lab Results  Component Value Date   HGBA1C 10.5 (H) 10/19/2021   Pt recalls eating about 2 meals per day at home. She does not usually eat breakfast. She typically eats lunch at work as she works at [employer] and is provided meals. On the way from work, she may pick up something to eat. She reports eating chicken and a variety of vegetables, rice and potatoes. She does not usually drink soda but occasionally drinks juices.   RD provided "Carbohydrate Counting for People with Diabetes" and "Plate Method" handout from the Academy of Nutrition and Dietetics. Discussed different food groups and their effects on blood sugar, emphasizing carbohydrate-containing foods. Provided list of carbohydrates and recommended serving sizes of common foods.  Discussed importance of controlled and consistent carbohydrate intake throughout the day. Provided examples of ways to balance meals/snacks and encouraged intake of high-fiber, whole grain complex carbohydrates. Teach back method used.  Expect good compliance.  Body mass index is 50.38 kg/m.   Current diet order is Carb Modified, patient is consuming approximately 25% of meals at this time. Labs and medications reviewed. No further nutrition interventions warranted at this time as pt is discharging home. RD contact information provided. If additional nutrition issues arise, please re-consult RD.  Jasmine Krause, RDN, LDN Clinical Nutrition

## 2021-10-21 NOTE — Progress Notes (Signed)
Discharge instructions given to patient with medication details, discussed insulin administration as well. Verbalized understanding of all instructions and medications.

## 2021-10-21 NOTE — Anesthesia Postprocedure Evaluation (Signed)
Anesthesia Post Note  Patient: Jasmine Krause  Procedure(s) Performed: LAPAROSCOPIC CHOLECYSTECTOMY (Abdomen)     Patient location during evaluation: PACU Anesthesia Type: General Level of consciousness: awake and alert Pain management: pain level controlled Vital Signs Assessment: post-procedure vital signs reviewed and stable Respiratory status: spontaneous breathing, nonlabored ventilation and respiratory function stable Cardiovascular status: blood pressure returned to baseline and stable Postop Assessment: no apparent nausea or vomiting Anesthetic complications: no   No notable events documented.  Last Vitals:  Vitals:   10/21/21 0457 10/21/21 0731  BP: (!) 144/84 (!) 153/79  Pulse: 96 95  Resp: 17 16  Temp: 36.5 C 36.7 C  SpO2: 98% 97%    Last Pain:  Vitals:   10/21/21 0731  TempSrc: Oral  PainSc:                  Averey Koning

## 2021-10-21 NOTE — Discharge Summary (Signed)
[facility] Discharge Summary   Patient ID: Jasmine Krause MRN: [id number] DOB/AGE: 1985-08-04 36 y.o.  Admit date: 10/18/2021 Discharge date: 10/21/2021  Admitting Diagnosis: Acute cholecystitis  Hyperglycemia   Discharge Diagnosis Acute cholecystitis s/p laparoscopic cholecystectomy  T2DM  Consultants Diabetes Coordinator RN  Imaging: No results found.  Procedures Dr. Michaelle Birks (10/19/21) - Laparoscopic Cholecystectomy   Hospital Course:  Patient is a 36 year old female who presented to the ED with abdominal pain.  Workup showed acute cholecystitis.  Patient was admitted and underwent procedure listed above.  Tolerated procedure well and was transferred to the floor.  Diet was advanced as tolerated.  On POD2, the patient was voiding well, tolerating diet, ambulating well, pain well controlled, vital signs stable, incisions c/d/i and felt stable for discharge home.  Patient will follow up in our office in 3 weeks and knows to call with questions or concerns.  She will call to confirm appointment date/time.    Of note patient was hyperglycemic on admission, denied hx of diabetes although does appear she was prescribed metformin as an outpatient. A1c 10, diabetes coordinator consulted and assisted with recommendations for medications on discharge and patient instructed to follow up with PCP.   Physical Exam: General:  Alert, NAD, pleasant, comfortable Abd:  Soft, ND, mild tenderness, incisions C/D/I  I or a member of my team have reviewed this patient in the Controlled Substance Database.   Allergies as of 10/21/2021   No Known Allergies      Medication List     TAKE these medications    acetaminophen 500 MG tablet Commonly known as: TYLENOL Take 2 tablets (1,000 mg total) by mouth every 6 (six) hours as needed for mild pain or fever. What changed:  when to take this reasons to take this   atorvastatin 20 MG tablet Commonly known as: LIPITOR Take 1  tablet (20 mg total) by mouth daily.   blood glucose meter kit and supplies Use up to four times daily as directed.   ibuprofen 800 MG tablet Commonly known as: ADVIL Take 1 tablet (800 mg total) by mouth every 8 (eight) hours as needed for moderate pain. Take with food.   Insulin Pen Needle 32G X 4 MM Misc Use to inject insulin as directed.   Lantus SoloStar 100 UNIT/ML Solostar Pen Generic drug: insulin glargine Inject 30 Units into the skin daily.   metFORMIN 500 MG tablet Commonly known as: GLUCOPHAGE Take 1 tablet (500 mg total) by mouth 2 (two) times daily with a meal. What changed: when to take this   mupirocin ointment 2 % Commonly known as: BACTROBAN Place 1 Application into the nose 2 (two) times daily.   norethindrone-ethinyl estradiol-iron 1.5-30 MG-MCG tablet Commonly known as: Aurovela Fe 1.5/30 Take 1 tablet by mouth daily. Continuous use for Dysmenorrhea   oxyCODONE 5 MG immediate release tablet Commonly known as: Oxy IR/ROXICODONE Take 1 tablet (5 mg total) by mouth every 6 (six) hours as needed for moderate pain or severe pain.   simethicone 80 MG chewable tablet Commonly known as: MYLICON Chew 1 tablet (80 mg total) by mouth 4 (four) times daily as needed for flatulence.          Follow-up Information     Camillia Herter, NP Follow up.   Specialty: Nurse Practitioner Contact information: [address] [place] [address] [phone]         Surgery, [place]. Schedule an appointment as soon as  possible for a visit in 3 week(s).   Specialty: General Surgery Why: Our office is working on scheduling a follow up with [employer], PA-C in about 3 weeks. Please call to confirm appointment date/time. Please arrive 30 min prior to appointment time. Have ID and insurance information with you. Contact information: [place] [address] [phone]                 Signed: Norm Parcel  , [facility] 10/21/2021, 9:30 AM Please see Amion for pager number during day hours 7:00am-4:30pm

## 2021-10-22 ENCOUNTER — Other Ambulatory Visit: Payer: Self-pay | Admitting: Obstetrics & Gynecology

## 2021-10-22 DIAGNOSIS — Z3041 Encounter for surveillance of contraceptive pills: Secondary | ICD-10-CM

## 2021-10-22 NOTE — Telephone Encounter (Signed)
Please contact pt to schedule annual exam pls and let me know when done so we can get her [facility] refilled. Thanks.

## 2021-10-22 NOTE — Telephone Encounter (Signed)
Scheduled for annual on 11/15/21.

## 2021-10-25 ENCOUNTER — Encounter: Payer: Self-pay | Admitting: Family

## 2021-10-25 ENCOUNTER — Telehealth: Payer: Self-pay

## 2021-10-25 NOTE — Telephone Encounter (Signed)
Transition Care Management Follow-up Telephone Call Date of discharge and from where: 10/21/2021, [facility] How have you been since you were released from the hospital? She said she feels okay, some gas pain, has not picked up the simethicone yet.  Any questions or concerns? Yes- noted above regarding gas pain.   Items Reviewed: Did the pt receive and understand the discharge instructions provided? Yes  Medications obtained and verified? Yes - she is new to insulin and said she has administered it herself since returning home and does not have any problems/questions at this time  Other? No  Any new allergies since your discharge? No  Dietary orders reviewed? Yes- she said she has not had much of an appetite yet.  Do you have support at home? Yes   Home Care and Equipment/Supplies: Were home health services ordered? no If so, what is the name of the agency? N/a  Has the agency set up a time to come to the patient's home? not applicable Were any new equipment or medical supplies ordered?  Yes: [employer] What is the name of the medical supply agency? She received it from the diabetic educator at the hospital  Were you able to get the supplies/equipment? yes Do you have any questions related to the use of the equipment or supplies? No  Functional Questionnaire: (I = Independent and D = Dependent) ADLs: independent  Follow up appointments reviewed:  PCP Hospital f/u appt confirmed? Yes  Scheduled to see Ricky Stabs, NP - 11/02/2021.  Specialist Hospital f/u appt confirmed? Yes  Scheduled to see surgeon - 11/16/1021.  Are transportation arrangements needed? No  If their condition worsens, is the pt aware to call PCP or go to the Emergency Dept.? Yes Was the patient provided with contact information for the PCP's office or ED? Yes Was to pt encouraged to call back with questions or concerns? Yes

## 2021-10-25 NOTE — Progress Notes (Signed)
TRANSITION OF CARE VISIT   Primary Care Provider (PCP):   Ricky Stabs, NP                                                     [facility] Primary [facility] at [facility] [address]    [place],  [place]  [address]     Phone: [phone]     Fax: [phone]     Date of Admission: 10/18/2021  Date of Discharge: 10/21/2021  Transitions of Care Call: 10/25/2021  Discharged from: [facility]  Discharge Diagnosis:  Acute cholecystitis s/p laparoscopic cholecystectomy  T2DM  Summary of Admission per PA note:  Hospital Course:  Patient is a 36 year old female who presented to the ED with abdominal pain.  Workup showed acute cholecystitis.  Patient was admitted and underwent procedure listed above.  Tolerated procedure well and was transferred to the floor.  Diet was advanced as tolerated.  On POD2, the patient was voiding well, tolerating diet, ambulating well, pain well controlled, vital signs stable, incisions c/d/i and felt stable for discharge home.  Patient will follow up in our office in 3 weeks and knows to call with questions or concerns.  She will call to confirm appointment date/time.     Of note patient was hyperglycemic on admission, denied hx of diabetes although does appear she was prescribed metformin as an outpatient. A1c 10, diabetes coordinator consulted and assisted with recommendations for medications on discharge and patient instructed to follow up with PCP.  Surgery, [place]. Schedule an appointment as soon as possible for a visit in 3 week(s).   Specialty: General Surgery Why: Our office is working on scheduling a follow up with [employer], PA-C in about 3 weeks. Please call to confirm appointment date/time. Please arrive 30 min prior to appointment time. Have ID and insurance information with you. Contact information: [address] [place] [address] [phone]    Today's Visit  - Scheduled for General Surgery follow-up on 11/16/2021.  - Doing well on Lantus. No longer taking Metformin as this caused upset stomach. Home blood sugars 150's. She is monitoring what she eats and exercising. Would like to know how long she has to continue taking insulin. No further issues/concerns.    Patient/Caregiver self-reported problems/concerns: see above  MEDICATIONS  Medication Reconciliation conducted with patient/caregiver? (Yes/ No): Yes  New medications prescribed/discontinued upon discharge? (Yes/No): Yes  Barriers identified related to medications: No  LABS  Lab Reviewed (Yes/No/NA): Yes  PHYSICAL EXAM:  Physical Exam HENT:     Head: Normocephalic and atraumatic.  Eyes:     Extraocular Movements: Extraocular movements intact.     Conjunctiva/sclera: Conjunctivae normal.     Pupils: Pupils are equal, round, and reactive to light.  Cardiovascular:     Rate and Rhythm: Normal rate and regular rhythm.     Pulses: Normal pulses.     Heart sounds: Normal heart sounds.  Pulmonary:     Effort: Pulmonary effort is normal.     Breath sounds: Normal breath sounds.  Musculoskeletal:     Cervical back: Normal range of motion and neck supple.  Neurological:     General: No focal deficit present.     Mental Status: She is alert and oriented to person, place, and time.  Psychiatric:        Mood and Affect: Mood normal.        Behavior: Behavior normal.    ASSESSMENT AND PLAN: 1. Hospital discharge follow-up - Reviewed hospital course, current medications, ensured proper follow-up in place, and addressed concerns.   2. S/P cholecystectomy - Keep all scheduled appointments with General Surgery.   3. Type 2 diabetes mellitus without complication, without long-term current use of insulin (HCC) - Hemoglobin A1c 10.5% on 10/19/2021. - Continue Insulin Glargine as prescribed. No refills needed as of present. - Metformin discontinued related to  gastrointestinal side effects. Replaced with Metformin XR with goal of curbing the same.  - Discussed the importance of healthy eating habits, low-carbohydrate diet, low-sugar diet, regular aerobic exercise (at least 150 minutes a week as tolerated) and medication compliance to achieve or maintain control of diabetes. - To achieve an A1C goal of less than or equal to 7.0 percent, a fasting blood sugar of 80 to 130 mg/dL and a postprandial glucose (90 to 120 minutes after a meal) less than 180 mg/dL. In the event of sugars less than 60 mg/dl or greater than 814 mg/dl please notify the clinic ASAP. It is recommended that you undergo annual eye exams and annual foot exams. - Follow-up with clinical pharmacist in 4 weeks for diabetes checkup. Write your home blood sugar results down each day and bring those results to your appointment along with your home glucose monitor. Medications may be revised at that time if needed. Repeat hemoglobin A1c at pharmacist appointment and forward results to me. - Follow-up with primary provider as scheduled.  - metFORMIN (GLUCOPHAGE-XR) 500 MG 24 hr tablet; Take 1 tablet (500 mg total) by mouth daily with breakfast.  Dispense: 30 tablet; Refill: 2 - POCT glycosylated hemoglobin (Hb A1C); Future   PATIENT EDUCATION PROVIDED: See AVS   FOLLOW-UP (Include any further testing or referrals):  - Follow-up with clinical pharmacist in 4 weeks.  - Follow-up with primary provider as scheduled.   Patient was given clear instructions to go to Emergency Department or return to medical center if symptoms don't improve, worsen, or new problems develop.The patient verbalized understanding.

## 2021-11-02 ENCOUNTER — Encounter: Payer: Self-pay | Admitting: Family

## 2021-11-02 ENCOUNTER — Ambulatory Visit: Payer: No Typology Code available for payment source | Admitting: Family

## 2021-11-02 VITALS — BP 140/86 | HR 99 | Temp 98.6°F | Wt 328.0 lb

## 2021-11-02 DIAGNOSIS — E119 Type 2 diabetes mellitus without complications: Secondary | ICD-10-CM

## 2021-11-02 DIAGNOSIS — Z09 Encounter for follow-up examination after completed treatment for conditions other than malignant neoplasm: Secondary | ICD-10-CM

## 2021-11-02 DIAGNOSIS — Z9049 Acquired absence of other specified parts of digestive tract: Secondary | ICD-10-CM | POA: Diagnosis not present

## 2021-11-02 MED ORDER — METFORMIN HCL ER 500 MG PO TB24: 500.0000 mg | ORAL_TABLET | Freq: Every day | ORAL | 2 refills | Status: DC

## 2021-11-02 NOTE — Progress Notes (Signed)
Pt presents for hospital f/u

## 2021-11-02 NOTE — Patient Instructions (Signed)

## 2021-11-13 ENCOUNTER — Other Ambulatory Visit (HOSPITAL_COMMUNITY): Payer: Self-pay

## 2021-11-15 ENCOUNTER — Ambulatory Visit (INDEPENDENT_AMBULATORY_CARE_PROVIDER_SITE_OTHER): Payer: No Typology Code available for payment source | Admitting: Obstetrics & Gynecology

## 2021-11-15 ENCOUNTER — Encounter: Payer: Self-pay | Admitting: Obstetrics & Gynecology

## 2021-11-15 ENCOUNTER — Other Ambulatory Visit (HOSPITAL_COMMUNITY)
Admission: RE | Admit: 2021-11-15 | Discharge: 2021-11-15 | Disposition: A | Discharge status: Home / Self Care | Payer: No Typology Code available for payment source | Source: Ambulatory Visit | Attending: Obstetrics & Gynecology | Admitting: Obstetrics & Gynecology

## 2021-11-15 VITALS — BP 134/80 | Ht 68.0 in | Wt 330.0 lb

## 2021-11-15 DIAGNOSIS — N870 Mild cervical dysplasia: Secondary | ICD-10-CM | POA: Diagnosis present

## 2021-11-15 DIAGNOSIS — Z3041 Encounter for surveillance of contraceptive pills: Secondary | ICD-10-CM

## 2021-11-15 DIAGNOSIS — Z01419 Encounter for gynecological examination (general) (routine) without abnormal findings: Secondary | ICD-10-CM | POA: Insufficient documentation

## 2021-11-15 MED ORDER — NORETHIN ACE-ETH ESTRAD-FE 1.5-30 MG-MCG PO TABS: 1.0000 | ORAL_TABLET | Freq: Every day | ORAL | 4 refills | Status: DC

## 2021-11-15 NOTE — Progress Notes (Signed)
Jasmine Krause [birth year] [id number]   History:    36 y.o.  G1P1L1 stable partner/FOB.  Daughter 37 yo.   RP:  Established patient presenting for annual gyn exam    HPI: Well on BCPs with [facility], but having dysmenorrhea.  No BTB.  No pelvic pain.  No pain with IC.  Pap 09/2020 with HR HPV Positive, Pap was Neg.  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 Pap/HPV HR today.  Urine/BMs normal.  Breasts normal.  BMI 50.18.  Continues with decreased overall calories and drinks more water.   Health Labs with [facility] NP.   Past medical history,surgical history, family history and social history were all reviewed and documented in the EPIC chart.  Gynecologic History Patient's last menstrual period was 10/19/2021 (approximate).  Obstetric History OB History  Gravida Para Term Preterm AB Living  1 1 1  0 0 1  SAB IAB Ectopic Multiple Live Births  0 0 0 0 1    # Outcome Date GA Lbr Len/2nd Weight Sex Delivery Anes PTL Lv  1 Term 02/28/12 [redacted]w[redacted]d / 00:41 6 lb 14.1 oz (3.121 kg) F Vag-Spont EPI  LIV     ROS: A ROS was performed and pertinent positives and negatives are included in the history. GENERAL: No fevers or chills. HEENT: No change in vision, no earache, sore throat or sinus congestion. NECK: No pain or stiffness. CARDIOVASCULAR: No chest pain or pressure. No palpitations. PULMONARY: No shortness of breath, cough or wheeze. GASTROINTESTINAL: No abdominal pain, nausea, vomiting or diarrhea, melena or bright red blood per rectum. GENITOURINARY: No urinary frequency, urgency, hesitancy or dysuria. MUSCULOSKELETAL: No joint or muscle pain, no back pain, no recent trauma. DERMATOLOGIC: No rash, no itching, no lesions. ENDOCRINE: No polyuria, polydipsia, no heat or cold intolerance. No recent change in weight. HEMATOLOGICAL: No anemia or easy bruising or bleeding. NEUROLOGIC: No headache, seizures, numbness, tingling or weakness. PSYCHIATRIC: No depression, no loss of interest in normal activity or change in  sleep pattern.     Exam:   BP 134/80 (BP Location: Right Arm, Patient Position: Sitting, Cuff Size: Large)   Ht 5\' 8"  (1.727 m)   Wt (!) 330 lb (149.7 kg)   LMP 10/19/2021 (Approximate)   BMI 50.18 kg/m   Body mass index is 50.18 kg/m.  General appearance : Well developed well nourished female. No acute distress HEENT: Eyes: no retinal hemorrhage or exudates,  Neck supple, trachea midline, no carotid bruits, no thyroidmegaly Lungs: Clear to auscultation, no rhonchi or wheezes, or rib retractions  Heart: Regular rate and rhythm, no murmurs or gallops Breast:Examined in sitting and supine position were symmetrical in appearance, no palpable masses or tenderness,  no skin retraction, no nipple inversion, no nipple discharge, no skin discoloration, no axillary or supraclavicular lymphadenopathy Abdomen: no palpable masses or tenderness, no rebound or guarding Extremities: no edema or skin discoloration or tenderness  Pelvic: Vulva: Normal             Vagina: No gross lesions or discharge  Cervix: No gross lesions or discharge.  Pap/HPV HR done.  Uterus  AV, normal size, shape and consistency, non-tender and mobile  Adnexa  Without masses or tenderness  Anus: Normal   Assessment/Plan:  36 y.o. female for annual exam   1. Encounter for routine gynecological examination with Papanicolaou smear of cervix Well on BCPs with [facility], but having dysmenorrhea.  No BTB.  No pelvic pain.  No pain with IC.  Pap 09/2020 with HR HPV  Positive, Pap was Neg.  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 Pap/HPV HR today.  Urine/BMs normal.  Breasts normal.  BMI 50.18.  Continues with decreased overall calories and drinks more water.   Health Labs with Fam NP. - Cytology - PAP( [place])  2. Mild cervical dysplasia, histologically confirmed - Cytology - PAP( [place])  3. Encounter for surveillance of contraceptive pills Well on BCPs with [facility], but having dysmenorrhea.  No BTB.  No pelvic pain.  No  pain with IC.  - norethindrone-ethinyl estradiol-iron (HAILEY FE 1.5/30) 1.5-30 MG-MCG tablet; Take 1 tablet by mouth daily.  4. Obesity, morbid, BMI 50 or higher (HCC)  BMI 50.18.  Continues with decreased overall calories and drinks more water.  Genia Del MD, 9:13 AM 11/15/2021

## 2021-11-16 ENCOUNTER — Other Ambulatory Visit: Payer: Self-pay | Admitting: Family

## 2021-11-16 ENCOUNTER — Other Ambulatory Visit (HOSPITAL_COMMUNITY): Payer: Self-pay

## 2021-11-16 NOTE — Telephone Encounter (Signed)
Medication Refill - Medication: insulin glargine (LANTUS SOLOSTAR) 100 UNIT/ML Solostar Pen  Has the patient contacted their pharmacy? No. PCP needed to send in rx.  Preferred Pharmacy (with phone number or street name):  [id number] - [place], [place] - [address] AT [facility] [address] Phone:  [phone]  Fax:  [phone]     Has the patient been seen for an appointment in the last year OR does the patient have an upcoming appointment? Yes.

## 2021-11-17 MED ORDER — LANTUS SOLOSTAR 100 UNIT/ML ~~LOC~~ SOPN: 30.0000 [IU] | PEN_INJECTOR | Freq: Every day | SUBCUTANEOUS | 0 refills | Status: DC

## 2021-11-17 NOTE — Telephone Encounter (Signed)
Requested Prescriptions  Pending Prescriptions Disp Refills  . insulin glargine (LANTUS SOLOSTAR) 100 UNIT/ML Solostar Pen 9 mL 0    Sig: Inject 30 Units into the skin daily.     Endocrinology:  Diabetes - Insulins Failed - 11/16/2021  1:20 PM      Failed - HBA1C is between 0 and 7.9 and within 180 days    Hgb A1c MFr Bld  Date Value Ref Range Status  10/19/2021 10.5 (H) 4.8 - 5.6 % Final    Comment:    (NOTE) Pre diabetes:          5.7%-6.4%  Diabetes:              >6.4%  Glycemic control for   <7.0% adults with diabetes          Passed - Valid encounter within last 6 months    Recent Outpatient Visits          2 weeks ago Hospital discharge follow-up   Primary Care at [facility], [place], NP   1 year ago Annual physical exam   Primary Care at [facility], Amy J, NP   1 year ago Encounter to establish care   Primary Care at [facility] [place] [facility], Amy J, NP   8 years ago Vaginal itching   Primary Care at [facility], [place], [place]      Future Appointments            In 2 weeks Lois Huxley, Cornelius Moras, RPH-CPP [facility]   In 3 weeks Rema Fendt, NP Primary Care at [facility]

## 2021-11-18 ENCOUNTER — Other Ambulatory Visit: Payer: Self-pay | Admitting: Family

## 2021-11-18 ENCOUNTER — Encounter: Payer: Self-pay | Admitting: Family

## 2021-11-18 LAB — CYTOLOGY - PAP
Comment: NEGATIVE
Diagnosis: UNDETERMINED — AB
High risk HPV: NEGATIVE

## 2021-11-20 ENCOUNTER — Other Ambulatory Visit: Payer: Self-pay | Admitting: Family

## 2021-11-20 DIAGNOSIS — E785 Hyperlipidemia, unspecified: Secondary | ICD-10-CM

## 2021-11-22 MED ORDER — ATORVASTATIN CALCIUM 20 MG PO TABS: 20.0000 mg | ORAL_TABLET | Freq: Every day | ORAL | 0 refills | Status: DC

## 2021-12-03 ENCOUNTER — Encounter: Payer: Self-pay | Admitting: Pharmacist

## 2021-12-03 ENCOUNTER — Ambulatory Visit: Payer: No Typology Code available for payment source | Attending: Family Medicine | Admitting: Pharmacist

## 2021-12-03 DIAGNOSIS — E119 Type 2 diabetes mellitus without complications: Secondary | ICD-10-CM

## 2021-12-03 MED ORDER — FREESTYLE LIBRE 2 SENSOR MISC
2 refills | Status: DC
Start: 2021-12-03 — End: 2024-01-08

## 2021-12-03 MED ORDER — FREESTYLE LIBRE 2 READER DEVI
0 refills | Status: DC
Start: 2021-12-03 — End: 2024-01-08

## 2021-12-03 MED ORDER — METFORMIN HCL ER 500 MG PO TB24: 1000.0000 mg | ORAL_TABLET | Freq: Every day | ORAL | 0 refills | Status: DC

## 2021-12-03 NOTE — Progress Notes (Signed)
S:    PCP: Ricky Stabs  No chief complaint on file.  Jasmine Krause is a 36 y.o. female who presents for diabetes evaluation, education, and management.  PMH is significant for T2DM, HLD, cholecystitis S/p cholecystectomy in July of this year.   Patient was referred and last seen by Primary Care Provider, Ricky Stabs, on 11/02/2021. At that visit, metformin was changed to XR.   Today, patient arrives in good spirits and presents without any assistance. She reports Diabetes was diagnosed in July in this year prior to having her gallbladder removed. She had a hx of preDM but T2 dx is new. She is tolerating the metformin well in the XR form and is compliant with her insulin. She has no hx of ACS/CAD, CHF, CKD, or stroke. No pancreatitis or thyroid cancer hx.  Family/Social History:  Fhx: DM, HTN Tobacco: former smoker  Alcohol: none reported  Current diabetes medications include: metformin 500 mg XR (in the morning), Lantus 30u   Patient reports adherence to taking all medications as prescribed.   Insurance coverage: [employer]   Patient denies hypoglycemic events.  Reported home fasting blood sugars: none   Reported 2 hour post-meal/random blood sugars: 160s-200s.   Patient denies nocturia (nighttime urination).  Patient denies neuropathy (nerve pain). Patient denies visual changes. Patient reports self foot exams.   Patient reported dietary habits: Eats 2-3 meals/day -BF: Zero sugar yogurt/granola  -Lunch: salad with chicken  -Incorporates more fruits and vegetables  -Has decreased her intake of potatoes, pasta  -Does not eat bread  -Does admit to having affinity for sweets (ice cream) but has decreased intake since dx   Patient-reported exercise habits:  -Steps at work, Exercise ring closes on her apple watch during the day.  -Will walk outside of work occasionally   O:   ROS  Physical Exam  7 day average blood glucose: no meter with her   No CGM in  place.    Lab Results  Component Value Date   HGBA1C 10.5 (H) 10/19/2021   There were no vitals filed for this visit.  Lipid Panel     Component Value Date/Time   CHOL 272 (H) 06/17/2020 1000   TRIG 81 06/17/2020 1000   HDL 78 06/17/2020 1000   CHOLHDL 3.5 06/17/2020 1000   LDLCALC 181 (H) 06/17/2020 1000    Clinical Atherosclerotic Cardiovascular Disease (ASCVD): No  The ASCVD Risk score (Arnett DK, et al., 2019) failed to calculate for the following reasons:   The 2019 ASCVD risk score is only valid for ages 21 to 63    A/P: Diabetes newly dx currently uncontrolled but home sugar levels suggest improvement. Patient is able to verbalize appropriate hypoglycemia management plan. Medication adherence appears appropriate. She wishes to come off of medications if appropriate in the future.  -Continue Lantus 30u daily. -Increased dose of metformin 500 mg XR to 2 tablets (1000mg  total) in the morning. If tolerable, we can consider increasing to 1000 mg BID and decreasing Lantus dose at next visit.  -Patient educated on purpose, proper use, and potential adverse effects of metformin, Lantus.  -Extensively discussed pathophysiology of diabetes, recommended lifestyle interventions, dietary effects on blood sugar control.  -Counseled on s/sx of and management of hypoglycemia.  -Next A1c anticipated 01/2022.  Written patient instructions provided. Patient verbalized understanding of treatment plan.  Total time in face to face counseling 20 minutes.    Follow-up:  Pharmacist in 4-6 weeks. PCP clinic visit next week.  Benard Halsted, PharmD, [place], [place] [phone]

## 2021-12-05 NOTE — Progress Notes (Signed)
Patient ID: Jasmine Krause, female    DOB: [birth year]  MRN: [id number]  CC: Annual Physical Exam  Subjective: Jasmine Krause is a 36 y.o. female who presents for annual physical exam.   Her concerns today include:  12/03/2021 [facility] with Acquanetta Belling Ausdall, RPH-CPP per note:  A/P: Diabetes newly dx currently uncontrolled but home sugar levels suggest improvement. Patient is able to verbalize appropriate hypoglycemia management plan. Medication adherence appears appropriate. She wishes to come off of medications if appropriate in the future.  -Continue Lantus 30u daily. -Increased dose of metformin 500 mg XR to 2 tablets (1039m total) in the morning. If tolerable, we can consider increasing to 1000 mg BID and decreasing Lantus dose at next visit.  -Patient educated on purpose, proper use, and potential adverse effects of metformin, Lantus.  -Extensively discussed pathophysiology of diabetes, recommended lifestyle interventions, dietary effects on blood sugar control.  -Counseled on s/sx of and management of hypoglycemia.  -Next A1c anticipated 01/2022.  Today's visit 12/14/2021: Metformin causing upset stomach and sometimes unable to tolerate. No additional issues or concerns. She is aware of appointment with pharmacist 01/14/2022.  Patient Active Problem List   Diagnosis Date Noted   Cholecystitis 10/18/2021   Prediabetes 06/18/2020   Hyperlipidemia 06/18/2020   Morbid obesity ([place] 05/15/2013     Current Outpatient Medications on File Prior to Visit  Medication Sig Dispense Refill   Accu-Chek Softclix Lancets lancets use as directed to check blood sugar 100 each 0   acetaminophen (TYLENOL) 500 MG tablet Take 2 tablets (1,000 mg total) by mouth every 6 (six) hours as needed for mild pain or fever.     atorvastatin (LIPITOR) 20 MG tablet Take 1 tablet (20 mg total) by mouth daily. 90 tablet 0   blood glucose meter kit and supplies Use up to four times daily as  directed. 1 each 0   Continuous Blood Gluc Receiver (FREESTYLE LIBRE 2 READER) DEVI Use to check blood sugar TID. 1 each 0   Continuous Blood Gluc Sensor (FREESTYLE LIBRE 2 SENSOR) MISC Use to check blood sugar TID. 2 each 2   glucose blood test strip use to check blood sugars up to four times daily 100 each 0   ibuprofen (ADVIL) 800 MG tablet Take 1 tablet (800 mg total) by mouth every 8 (eight) hours as needed for moderate pain. Take with food. 30 tablet 0   insulin glargine (LANTUS SOLOSTAR) 100 UNIT/ML Solostar Pen Inject 30 Units into the skin daily. 9 mL 0   Insulin Pen Needle 32G X 4 MM MISC Use to inject insulin as directed. 100 each 0   JUNEL FE 1.5/30 1.5-30 MG-MCG tablet TAKE 1 TABLET BY MOUTH DAILY 28 tablet 11   metFORMIN (GLUCOPHAGE-XR) 500 MG 24 hr tablet Take 2 tablets (1,000 mg total) by mouth daily with breakfast. 180 tablet 0   mupirocin ointment (BACTROBAN) 2 % Place 1 Application into the nose 2 (two) times daily. 22 g 0   oxyCODONE (OXY IR/ROXICODONE) 5 MG immediate release tablet Take 1 tablet (5 mg total) by mouth every 6 (six) hours as needed for moderate pain or severe pain. 20 tablet 0   simethicone (MYLICON) 80 MG chewable tablet Chew 1 tablet (80 mg total) by mouth 4 (four) times daily as needed for flatulence.     No current facility-administered medications on file prior to visit.    No Known Allergies  Social History   Socioeconomic History  Marital status: Single    Spouse name: n/a   Number of children: 0   Years of education: Associates   Highest education level: Not on file  Occupational History   Occupation: TODDLER Product manager: [employer]  Tobacco Use   Smoking status: Former    Passive exposure: Past   Smokeless tobacco: Never  Scientific laboratory technician Use: Former  Substance and Sexual Activity   Alcohol use: Not Currently   Drug use: No   Sexual activity: Not Currently    Partners: Male    Birth control/protection: Pill     Comment: intercourse age 24, less than 5 sexual pafrtners, CURRENT PARTNER- 3 YRS  Other Topics Concern   Not on file  Social History Narrative   Lives with her boyfriend and their daughter.   Social Determinants of Health   Financial Resource Strain: Not on file  Food Insecurity: Not on file  Transportation Needs: Not on file  Physical Activity: Not on file  Stress: Not on file  Social Connections: Not on file  Intimate Partner Violence: Not on file    Family History  Problem Relation Age of Onset   Diabetes Mother    Hypertension Mother    Diabetes Maternal Grandmother    Hypertension Maternal Grandmother    Other Neg Hx     Past Surgical History:  Procedure Laterality Date   CHOLECYSTECTOMY N/A 10/19/2021   Procedure: LAPAROSCOPIC CHOLECYSTECTOMY;  Surgeon: Dwan Bolt, MD;  Location: [facility];  Service: General;  Laterality: N/A;    ROS: Review of Systems Negative except as stated above  PHYSICAL EXAM: BP 137/85 (BP Location: Left Arm, Patient Position: Sitting, Cuff Size: Large)   Pulse 99   Temp 98.3 F (36.8 C)   Resp 16   Ht 5' 7.99" (1.727 m)   Wt (!) 326 lb (147.9 kg)   LMP 10/19/2021 (Approximate)   SpO2 99%   BMI 49.58 kg/m   Physical Exam HENT:     Head: Normocephalic and atraumatic.     Right Ear: Tympanic membrane, ear canal and external ear normal.     Left Ear: Tympanic membrane, ear canal and external ear normal.     Nose: Nose normal.     Mouth/Throat:     Mouth: Mucous membranes are moist.     Pharynx: Oropharynx is clear.  Eyes:     Extraocular Movements: Extraocular movements intact.     Conjunctiva/sclera: Conjunctivae normal.     Pupils: Pupils are equal, round, and reactive to light.  Cardiovascular:     Rate and Rhythm: Normal rate and regular rhythm.     Pulses: Normal pulses.     Heart sounds: Normal heart sounds.  Pulmonary:     Effort: Pulmonary effort is normal.     Breath sounds: Normal breath sounds.  Chest:      Comments: Patient declined.  Abdominal:     General: Bowel sounds are normal.     Palpations: Abdomen is soft.  Genitourinary:    Comments: Patient declined.  Musculoskeletal:        General: Normal range of motion.     Right shoulder: Normal.     Left shoulder: Normal.     Right upper arm: Normal.     Left upper arm: Normal.     Right elbow: Normal.     Left elbow: Normal.     Right forearm: Normal.     Left forearm: Normal.  Right wrist: Normal.     Left wrist: Normal.     Right hand: Normal.     Left hand: Normal.     Cervical back: Normal, normal range of motion and neck supple.     Thoracic back: Normal.     Right hip: Normal.     Left hip: Normal.     Right upper leg: Normal.     Left upper leg: Normal.     Right knee: Normal.     Left knee: Normal.     Right lower leg: Normal.     Left lower leg: Normal.     Right ankle: Normal.     Left ankle: Normal.     Right foot: Normal.     Left foot: Normal.  Skin:    General: Skin is warm and dry.     Capillary Refill: Capillary refill takes less than 2 seconds.  Neurological:     General: No focal deficit present.     Mental Status: She is alert and oriented to person, place, and time.  Psychiatric:        Mood and Affect: Mood normal.        Behavior: Behavior normal.    ASSESSMENT AND PLAN: 1. Annual physical exam - Counseled on 150 minutes of exercise per week as tolerated, healthy eating (including decreased daily intake of saturated fats, cholesterol, added sugars, sodium), STI prevention, and routine healthcare maintenance.  2. Thyroid disorder screen - TSH to check thyroid function.  - TSH  3. Type 2 diabetes mellitus without complication, with long-term current use of insulin (HCC) - Routine screening microalbumin creatinine urine ratio.  - Keep follow-up appointment 01/14/2022 with RPH-CPP [place] with primary provider as scheduled.  - Microalbumin / creatinine urine  ratio  4. Hyperlipidemia, unspecified hyperlipidemia type - Lipid panel to screen for high cholesterol.  - Lipid panel    Patient was given the opportunity to ask questions.  Patient verbalized understanding of the plan and was able to repeat key elements of the plan. Patient was given clear instructions to go to Emergency Department or return to medical center if symptoms don't improve, worsen, or new problems develop.The patient verbalized understanding.   Orders Placed This Encounter  Procedures   Microalbumin / creatinine urine ratio   TSH   Lipid panel    Return in about 1 year (around 12/15/2022) for Physical per patient preference.  Camillia Herter, NP

## 2021-12-08 ENCOUNTER — Other Ambulatory Visit: Payer: Self-pay | Admitting: Obstetrics & Gynecology

## 2021-12-08 DIAGNOSIS — Z3041 Encounter for surveillance of contraceptive pills: Secondary | ICD-10-CM

## 2021-12-08 NOTE — Telephone Encounter (Signed)
Medication refill request: junel fe 1.5/30 Last AEX:  11-15-21 Next AEX: 11-17-22 Last MMG (if hormonal medication request): none Refill authorized: please approve until aex next year if appropriate

## 2021-12-14 ENCOUNTER — Ambulatory Visit (INDEPENDENT_AMBULATORY_CARE_PROVIDER_SITE_OTHER): Payer: No Typology Code available for payment source | Admitting: Family

## 2021-12-14 ENCOUNTER — Encounter: Payer: Self-pay | Admitting: Family

## 2021-12-14 VITALS — BP 137/85 | HR 99 | Temp 98.3°F | Resp 16 | Ht 67.99 in | Wt 326.0 lb

## 2021-12-14 DIAGNOSIS — E119 Type 2 diabetes mellitus without complications: Secondary | ICD-10-CM | POA: Diagnosis not present

## 2021-12-14 DIAGNOSIS — Z0000 Encounter for general adult medical examination without abnormal findings: Secondary | ICD-10-CM

## 2021-12-14 DIAGNOSIS — Z0001 Encounter for general adult medical examination with abnormal findings: Secondary | ICD-10-CM | POA: Diagnosis not present

## 2021-12-14 DIAGNOSIS — Z794 Long term (current) use of insulin: Secondary | ICD-10-CM | POA: Diagnosis not present

## 2021-12-14 DIAGNOSIS — E785 Hyperlipidemia, unspecified: Secondary | ICD-10-CM | POA: Diagnosis not present

## 2021-12-14 DIAGNOSIS — Z1329 Encounter for screening for other suspected endocrine disorder: Secondary | ICD-10-CM

## 2021-12-14 NOTE — Patient Instructions (Signed)

## 2021-12-15 ENCOUNTER — Encounter: Payer: Self-pay | Admitting: Family

## 2021-12-15 LAB — LIPID PANEL
Chol/HDL Ratio: 3.4 ratio (ref 0.0–4.4)
Cholesterol, Total: 176 mg/dL (ref 100–199)
HDL: 52 mg/dL (ref >39)
LDL Chol Calc (NIH): 107 mg/dL — ABNORMAL HIGH (ref 0–99)
Triglycerides: 92 mg/dL (ref 0–149)
VLDL Cholesterol Cal: 17 mg/dL (ref 5–40)

## 2021-12-15 LAB — MICROALBUMIN / CREATININE URINE RATIO
Creatinine, Urine: 178.5 mg/dL
Microalb/Creat Ratio: 15 mg/g creat (ref 0–29)
Microalbumin, Urine: 26.1 ug/mL

## 2021-12-15 LAB — TSH: TSH: 0.82 u[IU]/mL (ref 0.450–4.500)

## 2021-12-23 ENCOUNTER — Other Ambulatory Visit: Payer: Self-pay | Admitting: Family

## 2021-12-24 NOTE — Telephone Encounter (Signed)
Order complete. 

## 2022-01-14 ENCOUNTER — Other Ambulatory Visit: Payer: Self-pay

## 2022-01-14 ENCOUNTER — Ambulatory Visit: Payer: No Typology Code available for payment source | Attending: Family | Admitting: Pharmacist

## 2022-01-14 DIAGNOSIS — E119 Type 2 diabetes mellitus without complications: Secondary | ICD-10-CM

## 2022-01-14 LAB — POCT GLYCOSYLATED HEMOGLOBIN (HGB A1C): HbA1c, POC (controlled diabetic range): 8 % — AB (ref 0.0–7.0)

## 2022-01-14 MED ORDER — TRULICITY 0.75 MG/0.5ML ~~LOC~~ SOAJ
0.7500 mg | SUBCUTANEOUS | 2 refills | Status: DC
  Filled 2022-01-14 – 2022-02-18 (×2): qty 2, 28d supply, fill #0

## 2022-01-14 MED ORDER — LANTUS SOLOSTAR 100 UNIT/ML ~~LOC~~ SOPN: PEN_INJECTOR | SUBCUTANEOUS | 2 refills | Status: DC

## 2022-01-14 NOTE — Progress Notes (Signed)
S:    PCP: Durene Fruits  No chief complaint on file.  Jasmine Krause is a 36 y.o. female who presents for diabetes evaluation, education, and management.  PMH is significant for T2DM, HLD, cholecystitis S/p cholecystectomy in July of this year.   Patient was initially referred by Primary Care Provider, Durene Fruits, on 11/02/2021. I saw her on 12/03/21 and increased metformin. She saw Amy again on 12/14/2021.   Today, patient arrives in good spirits and presents without any assistance. She has done well since seeing me. A1c today is down to 8.0. She is still experiencing some GI side effects with metformin but is okay to continue therapy for now. She is compliant with her insulin. No complaints today.   Family/Social History:  Fhx: DM, HTN Tobacco: former smoker  Alcohol: none reported  Current diabetes medications include: metformin 500 mg XR (takes 2 tablets, 1000mg , in the morning), Lantus 30u   Patient reports adherence to taking all medications as prescribed.   Insurance coverage: [employer]   Patient denies hypoglycemic events.  Reported home fasting blood sugars: none   Reported 2 hour post-meal/random blood sugars: reports seeing mostly 100s since her last visit with me.  Patient denies nocturia (nighttime urination).  Patient denies neuropathy (nerve pain). Patient denies visual changes. Patient reports self foot exams.   Patient reported dietary habits: Eats 2-3 meals/day -BF: Zero sugar yogurt/granola  -Lunch: salad with chicken  -Incorporates more fruits and vegetables  -Has decreased her intake of potatoes, pasta  -Does not eat bread  -Does admit to having affinity for sweets (ice cream) but has decreased intake since dx   Patient-reported exercise habits:  -Steps at work, Exercise ring closes on her apple watch during the day.  -Will walk outside of work occasionally   O:   ROS  Physical Exam  7 day average blood glucose: no meter with her    No CGM in place.   Lab Results  Component Value Date   HGBA1C 8.0 (A) 01/14/2022   There were no vitals filed for this visit.  Lipid Panel     Component Value Date/Time   CHOL 176 12/14/2021 1048   TRIG 92 12/14/2021 1048   HDL 52 12/14/2021 1048   CHOLHDL 3.4 12/14/2021 1048   LDLCALC 107 (H) 12/14/2021 1048    Clinical Atherosclerotic Cardiovascular Disease (ASCVD): No  The ASCVD Risk score (Arnett DK, et al., 2019) failed to calculate for the following reasons:   The 2019 ASCVD risk score is only valid for ages 95 to 60    A/P: Diabetes newly dx with improvement. A1c down from 10.5 to 8.0% today. Commended her for this! Patient is able to verbalize appropriate hypoglycemia management plan. Medication adherence appears appropriate. We discussed adding a GLP-1 RA for weight loss, cardiorenal benefit, and to aid in decreasing exogenous insulin requirement. Pt is amenable to starting Trulicity. Would prefer Ozempic for better weight loss, but the Trulicity will be easier for her to use. -Decrease Lantus to 26u daily. -Continue metformin 1000 mg XR once daily.  -Start Trulicity 6.30 mg weekly.  -Patient educated on purpose, proper use, and potential adverse effects of Trulicity. -Extensively discussed pathophysiology of diabetes, recommended lifestyle interventions, dietary effects on blood sugar control.  -Counseled on s/sx of and management of hypoglycemia.  -Next A1c anticipated 03/2022.  Written patient instructions provided. Patient verbalized understanding of treatment plan.  Total time in face to face counseling 20 minutes.    Follow-up:  Pharmacist in 4-6 weeks. PCP clinic visit next week.  Butch Penny, PharmD, [place], CPP Clinical Pharmacist [facility] [phone]

## 2022-01-20 ENCOUNTER — Other Ambulatory Visit: Payer: Self-pay

## 2022-02-18 ENCOUNTER — Ambulatory Visit: Payer: No Typology Code available for payment source | Attending: Family Medicine | Admitting: Pharmacist

## 2022-02-18 ENCOUNTER — Other Ambulatory Visit: Payer: Self-pay

## 2022-02-18 DIAGNOSIS — E119 Type 2 diabetes mellitus without complications: Secondary | ICD-10-CM | POA: Diagnosis not present

## 2022-02-18 NOTE — Progress Notes (Signed)
    S:    PCP: Durene Fruits  No chief complaint on file.  Jasmine Krause is a 36 y.o. female who presents for diabetes evaluation, education, and management. PMH is significant for T2DM, HLD, cholecystitis S/p cholecystectomy in July of this year. Patient was last seen by Amy on 12/14/2021. I saw her on 2/99/3716 and started Trulicity.  Today, patient arrives in good spirits and presents without any assistance. She has done well since seeing me but reveals Trulicity was cost prohibitive. She is compliant with her insulin and metformin.  Family/Social History:  Fhx: DM, HTN Tobacco: former smoker  Alcohol: none reported  Current diabetes medications include: metformin 500 mg XR (takes 2 tablets, 1000mg , in the morning), Lantus 96V, Trulicity (did not pick-up d/t cost)  Insurance coverage: [employer]   Patient denies hypoglycemic events.  Reported home fasting blood sugars: none   Reported 2 hour post-meal/random blood sugars: reports seeing mostly 150s - 190s and usually checks right after eating.   Patient denies nocturia (nighttime urination).  Patient denies neuropathy (nerve pain). Patient denies visual changes. Patient reports self foot exams.   Patient reported dietary habits: Eats 2-3 meals/day -BF: Zero sugar yogurt/granola  -Lunch: salad with chicken  -Incorporates more fruits and vegetables  -Has decreased her intake of potatoes, pasta  -Does not eat bread  -Does admit to having affinity for sweets (ice cream) but has decreased intake since dx   Patient-reported exercise habits:  -Steps at work, Exercise ring closes on her apple watch during the day.  -Will walk outside of work occasionally   O:   ROS  Physical Exam  7 day average blood glucose: no meter with her   No CGM in place.   Lab Results  Component Value Date   HGBA1C 8.0 (A) 01/14/2022   There were no vitals filed for this visit.  Lipid Panel     Component Value Date/Time   CHOL 176  12/14/2021 1048   TRIG 92 12/14/2021 1048   HDL 52 12/14/2021 1048   CHOLHDL 3.4 12/14/2021 1048   LDLCALC 107 (H) 12/14/2021 1048    Clinical Atherosclerotic Cardiovascular Disease (ASCVD): No  The ASCVD Risk score (Arnett DK, et al., 2019) failed to calculate for the following reasons:   The 2019 ASCVD risk score is only valid for ages 27 to 72    A/P: Diabetes newly dx with improvement. A1c down from 10.5 to 8.0% in September. Commended her for this! Patient is able to verbalize appropriate hypoglycemia management plan. Medication adherence appears appropriate. We discussed options for cost management with Trulicity and she will pick this up from our pharmacy today.  -Continue Lantus 30u daily. -Continue metformin 1000 mg XR once daily.  -Start Trulicity 8.93 mg weekly.  -Patient educated on purpose, proper use, and potential adverse effects of Trulicity. -Extensively discussed pathophysiology of diabetes, recommended lifestyle interventions, dietary effects on blood sugar control.  -Counseled on s/sx of and management of hypoglycemia.  -Next A1c anticipated 03/2022.  Written patient instructions provided. Patient verbalized understanding of treatment plan.  Total time in face to face counseling 20 minutes.    Follow-up:  Pharmacist in 4-6 weeks.  Benard Halsted, PharmD, [place], [place] [phone]

## 2022-02-19 ENCOUNTER — Other Ambulatory Visit: Payer: Self-pay

## 2022-02-23 ENCOUNTER — Other Ambulatory Visit: Payer: Self-pay | Admitting: Family

## 2022-02-23 ENCOUNTER — Other Ambulatory Visit: Payer: Self-pay

## 2022-02-23 MED ORDER — PENTIPS 32G X 4 MM MISC
1.0000 | Freq: Every day | 0 refills | Status: AC
  Filled 2022-02-23: qty 100, 25d supply, fill #0

## 2022-02-25 ENCOUNTER — Other Ambulatory Visit: Payer: Self-pay

## 2022-02-25 DIAGNOSIS — E785 Hyperlipidemia, unspecified: Secondary | ICD-10-CM

## 2022-03-01 ENCOUNTER — Other Ambulatory Visit: Payer: Self-pay

## 2022-03-08 ENCOUNTER — Other Ambulatory Visit: Payer: Self-pay

## 2022-03-08 DIAGNOSIS — E785 Hyperlipidemia, unspecified: Secondary | ICD-10-CM

## 2022-03-08 MED ORDER — ATORVASTATIN CALCIUM 20 MG PO TABS: 20.0000 mg | ORAL_TABLET | Freq: Every day | ORAL | 0 refills | Status: DC

## 2022-03-14 NOTE — Progress Notes (Unsigned)
Patient ID: Jasmine Krause, female    DOB: [birth year]  MRN: [id number]  CC: Chronic Care Management   Subjective: Jasmine Krause is a 36 y.o. female who presents for chronic care management.   Her concerns today include:  02/18/2022 [place] per [facility] note: A/P: Diabetes newly dx with improvement. A1c down from 10.5 to 8.0% in January 11, 2023. Commended her for this! Patient is able to verbalize appropriate hypoglycemia management plan. Medication adherence appears appropriate. We discussed options for cost management with Trulicity and she will pick this up from our pharmacy today.  -Continue Lantus 30u daily. -Continue metformin 1000 mg XR once daily.  -Start Trulicity 3.64 mg weekly.  -Patient educated on purpose, proper use, and potential adverse effects of Trulicity. -Extensively discussed pathophysiology of diabetes, recommended lifestyle interventions, dietary effects on blood sugar control.  -Counseled on s/sx of and management of hypoglycemia.  -Next A1c anticipated 03/2022.  Today's visit 03/16/2022: - Doing well on diabetes medications. Checking blood sugars at home. She is monitoring what she eats. Having constipation and thinks related to diabetes medications. No red flag symptoms associated with constipation.  - Reports her best friend/mentor passed away unexpectedly 2022/01/10. They used to talk daily. Since then she is managing best as she can. She has a supportive family and a few supportive friends/associates. Restless sometimes during the night. She is not ready for medication management as of present. She is ready for counseling.   Patient Active Problem List   Diagnosis Date Noted   Cholecystitis 10/18/2021   Prediabetes 06/18/2020   Hyperlipidemia 06/18/2020   Morbid obesity ([place]) 05/15/2013     Current Outpatient Medications on File Prior to Visit  Medication Sig Dispense Refill   Accu-Chek Softclix Lancets lancets use as  directed to check blood sugar 100 each 0   acetaminophen (TYLENOL) 500 MG tablet Take 2 tablets (1,000 mg total) by mouth every 6 (six) hours as needed for mild pain or fever.     atorvastatin (LIPITOR) 20 MG tablet Take 1 tablet (20 mg total) by mouth daily. 90 tablet 0   blood glucose meter kit and supplies Use up to four times daily as directed. 1 each 0   Continuous Blood Gluc Receiver (FREESTYLE LIBRE 2 READER) DEVI Use to check blood sugar TID. 1 each 0   Continuous Blood Gluc Sensor (FREESTYLE LIBRE 2 SENSOR) MISC Use to check blood sugar TID. 2 each 2   glucose blood test strip use to check blood sugars up to four times daily 100 each 0   ibuprofen (ADVIL) 800 MG tablet Take 1 tablet (800 mg total) by mouth every 8 (eight) hours as needed for moderate pain. Take with food. 30 tablet 0   Insulin Pen Needle (PENTIPS) 32G X 4 MM MISC Use to inject insulin as directed. 100 each 0   JUNEL FE 1.5/30 1.5-30 MG-MCG tablet TAKE 1 TABLET BY MOUTH DAILY 28 tablet 11   mupirocin ointment (BACTROBAN) 2 % Place 1 Application into the nose 2 (two) times daily. 22 g 0   simethicone (MYLICON) 80 MG chewable tablet Chew 1 tablet (80 mg total) by mouth 4 (four) times daily as needed for flatulence.     No current facility-administered medications on file prior to visit.    No Known Allergies  Social History   Socioeconomic History   Marital status: Single    Spouse name: n/a   Number of children: 0   Years of education:  Associates   Highest education level: Not on file  Occupational History   Occupation: TODDLER Product manager: [employer]  Tobacco Use   Smoking status: Former    Passive exposure: Past   Smokeless tobacco: Never  Scientific laboratory technician Use: Former  Substance and Sexual Activity   Alcohol use: Not Currently   Drug use: No   Sexual activity: Not Currently    Partners: Male    Birth control/protection: Pill    Comment: intercourse age 35, less than 5 sexual  pafrtners, CURRENT PARTNER- 24 YRS  Other Topics Concern   Not on file  Social History Narrative   Lives with her boyfriend and their daughter.   Social Determinants of Health   Financial Resource Strain: Not on file  Food Insecurity: Not on file  Transportation Needs: Not on file  Physical Activity: Not on file  Stress: Not on file  Social Connections: Not on file  Intimate Partner Violence: Not on file    Family History  Problem Relation Age of Onset   Diabetes Mother    Hypertension Mother    Diabetes Maternal Grandmother    Hypertension Maternal Grandmother    Other Neg Hx     Past Surgical History:  Procedure Laterality Date   CHOLECYSTECTOMY N/A 10/19/2021   Procedure: LAPAROSCOPIC CHOLECYSTECTOMY;  Surgeon: Dwan Bolt, MD;  Location: [facility];  Service: General;  Laterality: N/A;    ROS: Review of Systems Negative except as stated above  PHYSICAL EXAM: BP 139/86   Pulse 99   Temp 98.3 F (36.8 C)   Wt (!) 313 lb (142 kg)   SpO2 98%   BMI 47.60 kg/m   Physical Exam HENT:     Head: Normocephalic and atraumatic.  Eyes:     Extraocular Movements: Extraocular movements intact.     Conjunctiva/sclera: Conjunctivae normal.     Pupils: Pupils are equal, round, and reactive to light.  Cardiovascular:     Rate and Rhythm: Normal rate and regular rhythm.     Pulses: Normal pulses.     Heart sounds: Normal heart sounds.  Pulmonary:     Effort: Pulmonary effort is normal.     Breath sounds: Normal breath sounds.  Musculoskeletal:     Cervical back: Normal range of motion and neck supple.  Neurological:     General: No focal deficit present.     Mental Status: She is alert and oriented to person, place, and time.  Psychiatric:        Mood and Affect: Mood normal. Affect is tearful.    Results for orders placed or performed in visit on 03/16/22  POCT glycosylated hemoglobin (Hb A1C)  Result Value Ref Range   Hemoglobin A1C 7.3 (A) 4.0 - 5.6 %   HbA1c  POC (<> result, manual entry)     HbA1c, POC (prediabetic range)     HbA1c, POC (controlled diabetic range)      ASSESSMENT AND PLAN: 1. Type 2 diabetes mellitus without complication, with long-term current use of insulin (HCC) - Hemoglobin A1c relative to goal at 7.3%, goal 7%. This is improved from previous 10.5% - Continue Metformin, Insulin Glargine, and Dulaglutide as prescribed.  - Discussed the importance of healthy eating habits, low-carbohydrate diet, low-sugar diet, regular aerobic exercise (at least 150 minutes a week as tolerated) and medication compliance to achieve or maintain control of diabetes. - Follow-up with primary provider in 3 months or sooner if needed.  -  POCT glycosylated hemoglobin (Hb A1C) - metFORMIN (GLUCOPHAGE-XR) 500 MG 24 hr tablet; Take 2 tablets (1,000 mg total) by mouth daily with breakfast.  Dispense: 60 tablet; Refill: 2 - insulin glargine (LANTUS SOLOSTAR) 100 UNIT/ML Solostar Pen; ADMINISTER 30 UNITS UNDER THE SKIN DAILY  Dispense: 15 mL; Refill: 2 - Dulaglutide (TRULICITY) 7.07 EM/7.5QG SOPN; Inject 0.75 mg into the skin once a week.  Dispense: 2 mL; Refill: 2  2. Constipation, unspecified constipation type - Constipation secondary to diabetic medications.  - Polyethylene Glycol Powder as prescribed. Discussed with patient to make sure she is having enough veggies, fruit, and water in diet to promote bowel movements.  - Follow-up with primary provider as scheduled.  - polyethylene glycol powder (GLYCOLAX/MIRALAX) 17 GM/SCOOP powder; Take 17 g by mouth daily as needed for mild constipation or moderate constipation.  Dispense: 3350 g; Refill: 1  3. Unresolved grief 4. Grief counseling - Patient reports her best friend/mentor passed away unexpectedly 2022/01/28. They used to talk daily. Since then patient is managing best as she can. She has a supportive family and a few supportive friends/associates. Restless sometimes during the night.  - Patient  declined pharmacological therapy.  - Patient declined referral to Psychiatry.  - Referral to Rosana Hoes, LCSWA for counseling/community resources.  - Follow-up with primary provider as scheduled.   5. Need for immunization against influenza - Administered.  - Flu Vaccine QUAD 57moIM (Fluarix, Fluzone & Alfiuria Quad PF)   Patient was given the opportunity to ask questions.  Patient verbalized understanding of the plan and was able to repeat key elements of the plan. Patient was given clear instructions to go to Emergency Department or return to medical center if symptoms don't improve, worsen, or new problems develop.The patient verbalized understanding.   Orders Placed This Encounter  Procedures   Flu Vaccine QUAD 686moM (Fluarix, Fluzone & Alfiuria Quad PF)   POCT glycosylated hemoglobin (Hb A1C)     Requested Prescriptions   Signed Prescriptions Disp Refills   polyethylene glycol powder (GLYCOLAX/MIRALAX) 17 GM/SCOOP powder 3350 g 1    Sig: Take 17 g by mouth daily as needed for mild constipation or moderate constipation.   metFORMIN (GLUCOPHAGE-XR) 500 MG 24 hr tablet 60 tablet 2    Sig: Take 2 tablets (1,000 mg total) by mouth daily with breakfast.   insulin glargine (LANTUS SOLOSTAR) 100 UNIT/ML Solostar Pen 15 mL 2    Sig: ADMINISTER 30 UNITS UNDER THE SKIN DAILY   Dulaglutide (TRULICITY) 0.9.20GFE/0.7HQOPN 2 mL 2    Sig: Inject 0.75 mg into the skin once a week.    Return in about 3 months (around 06/16/2022) for Follow-Up or next available chronic care mgmt .  AmCamillia HerterNP

## 2022-03-16 ENCOUNTER — Ambulatory Visit: Payer: No Typology Code available for payment source | Admitting: Family

## 2022-03-16 ENCOUNTER — Telehealth: Payer: Self-pay | Admitting: Family

## 2022-03-16 VITALS — BP 139/86 | HR 99 | Temp 98.3°F | Wt 313.0 lb

## 2022-03-16 DIAGNOSIS — Z794 Long term (current) use of insulin: Secondary | ICD-10-CM | POA: Diagnosis not present

## 2022-03-16 DIAGNOSIS — Z7189 Other specified counseling: Secondary | ICD-10-CM

## 2022-03-16 DIAGNOSIS — Z23 Encounter for immunization: Secondary | ICD-10-CM

## 2022-03-16 DIAGNOSIS — K5900 Constipation, unspecified: Secondary | ICD-10-CM

## 2022-03-16 DIAGNOSIS — F4321 Adjustment disorder with depressed mood: Secondary | ICD-10-CM | POA: Diagnosis not present

## 2022-03-16 DIAGNOSIS — E119 Type 2 diabetes mellitus without complications: Secondary | ICD-10-CM | POA: Diagnosis not present

## 2022-03-16 LAB — POCT GLYCOSYLATED HEMOGLOBIN (HGB A1C): Hemoglobin A1C: 7.3 % — AB (ref 4.0–5.6)

## 2022-03-16 MED ORDER — TRULICITY 0.75 MG/0.5ML ~~LOC~~ SOAJ: 0.7500 mg | SUBCUTANEOUS | 2 refills | Status: DC

## 2022-03-16 MED ORDER — POLYETHYLENE GLYCOL 3350 17 GM/SCOOP PO POWD: 17.0000 g | Freq: Every day | ORAL | 1 refills | Status: DC | PRN

## 2022-03-16 MED ORDER — LANTUS SOLOSTAR 100 UNIT/ML ~~LOC~~ SOPN: PEN_INJECTOR | SUBCUTANEOUS | 2 refills | Status: DC

## 2022-03-16 MED ORDER — METFORMIN HCL ER 500 MG PO TB24: 1000.0000 mg | ORAL_TABLET | Freq: Every day | ORAL | 2 refills | Status: DC

## 2022-03-16 NOTE — Progress Notes (Signed)
.  Pt presents for chronic care management .-.A1c 8.0% 01/14/22 down to 7.3%

## 2022-03-28 ENCOUNTER — Ambulatory Visit: Payer: No Typology Code available for payment source | Attending: Family | Admitting: Pharmacist

## 2022-03-28 DIAGNOSIS — E119 Type 2 diabetes mellitus without complications: Secondary | ICD-10-CM | POA: Diagnosis not present

## 2022-03-28 MED ORDER — TRULICITY 1.5 MG/0.5ML ~~LOC~~ SOAJ: 1.5000 mg | SUBCUTANEOUS | 1 refills | Status: DC

## 2022-03-28 NOTE — Progress Notes (Signed)
    S:    PCP: Ricky Stabs  No chief complaint on file.  Jasmine Krause is a 36 y.o. female who presents for diabetes evaluation, education, and management. PMH is significant for T2DM, HLD, cholecystitis S/p cholecystectomy in July of this year. Patient was last seen by Amy on 03/16/2022. Last seen by CPP on 02/18/2022.   At last visit with PCP, A1c was down to 7.3% and patient was instructed to increase Lantus from 26 to 30 units.   Today, patient arrives in good spirits and presents without any assistance. She endorses she did not increase Lantus to 30 units and has continued to do 26 units at night. Since starting Trulicity, she has had some constipation that resolves with Miralax (had 3 bowel movements yesterday).   Family/Social History:  Fhx: DM, HTN Tobacco: former smoker  Alcohol: none reported  Current diabetes medications include: metformin 500 mg XR (takes 2 tablets, 1000mg , in the morning), Lantus 26 units, Trulicity 0.75 mg weekly   Insurance coverage:   Patient denies hypoglycemic events.  Reported home fasting blood sugars: none, admits she has not been checking as often as she should Reported 2 hour post-meal/random blood sugars: not checking  Patient reports nocturia (nighttime urination). 1x/night Patient denies neuropathy (nerve pain). Patient denies visual changes. Patient reports self foot exams.   Patient reported dietary habits: cutting back on sweets, cut back on potatoes, drinks zero sugar beverages (no longer drinking juice).   Patient-reported exercise habits:  -Steps at work, Exercise ring closes on her apple watch during the day.  -Will walk outside of work occasionally  O:  7 day average blood glucose: no meter with her   No CGM in place. Did not pick up due to cost ($60)  Lab Results  Component Value Date   HGBA1C 7.3 (A) 03/16/2022   There were no vitals filed for this visit.  Lipid Panel     Component Value  Date/Time   CHOL 176 12/14/2021 1048   TRIG 92 12/14/2021 1048   HDL 52 12/14/2021 1048   CHOLHDL 3.4 12/14/2021 1048   LDLCALC 107 (H) 12/14/2021 1048    Clinical Atherosclerotic Cardiovascular Disease (ASCVD): No  The ASCVD Risk score (Arnett DK, et al., 2019) failed to calculate for the following reasons:   The 2019 ASCVD risk score is only valid for ages 77 to 27    A/P: Diabetes newly diagnosed with improvement. A1c close to goal (7.3%). Commended her for this! Patient is able to verbalize appropriate hypoglycemia management plan. Medication adherence appears appropriate. -Continue Lantus 26 units daily. Will not increase Lantus dose to 30 units given Trulicity dose increase.  -Continue metformin 1000 mg XR once daily.  -Increase Trulicity to 1.5 mg weekly. Instructed patient to contact the clinic if hypoglycemic symptoms occur or  constipation worsens with dose increase.  -Patient educated on purpose, proper use, and potential adverse effects of Trulicity. -Extensively discussed pathophysiology of diabetes, recommended lifestyle interventions, dietary effects on blood sugar control.  -Counseled on s/sx of and management of hypoglycemia.  -Next A1c anticipated at next PCP visit.   Written patient instructions provided. Patient verbalized understanding of treatment plan.  Total time in face to face counseling 20 minutes.    Follow-up:  Pharmacist PRN PCP visit in February 2024  March 2024, Valeda Malm.D. PGY-2 Ambulatory Care Pharmacy Resident 03/28/2022 1:41 PM

## 2022-04-15 ENCOUNTER — Other Ambulatory Visit: Payer: Self-pay | Admitting: Family Medicine

## 2022-04-15 DIAGNOSIS — Z794 Long term (current) use of insulin: Secondary | ICD-10-CM

## 2022-04-15 NOTE — Telephone Encounter (Signed)
A 90 day supply of Metformin ER ordered on 03/16/2022. Please call patient to make sure she is aware refills should be available at pharmacy. Notify me of any updates patient has regarding this.

## 2022-04-20 NOTE — Telephone Encounter (Signed)
Please assist. And route to me

## 2022-04-20 NOTE — Telephone Encounter (Signed)
LVM for pt to call back and f/u with Rosana Hoes

## 2022-05-12 ENCOUNTER — Encounter (HOSPITAL_COMMUNITY): Payer: Self-pay

## 2022-05-12 ENCOUNTER — Ambulatory Visit (HOSPITAL_COMMUNITY)
Admission: EM | Admit: 2022-05-12 | Discharge: 2022-05-12 | Disposition: A | Discharge status: Home / Self Care | Payer: No Typology Code available for payment source | Attending: Internal Medicine | Admitting: Internal Medicine

## 2022-05-12 DIAGNOSIS — M79672 Pain in left foot: Secondary | ICD-10-CM | POA: Diagnosis not present

## 2022-05-12 MED ORDER — IBUPROFEN 600 MG PO TABS: 600.0000 mg | ORAL_TABLET | Freq: Four times a day (QID) | ORAL | 0 refills | Status: DC | PRN

## 2022-05-12 NOTE — Discharge Instructions (Addendum)
Please get some shoe inserts to provide some cushion to your feet. Rest and elevate the affected painful area.   Apply cold compresses intermittently as needed.   Please take medications as prescribed As pain recedes, begin normal activities slowly as tolerated. Please return to urgent care if you have persistent pain or worsening pain.

## 2022-05-12 NOTE — ED Triage Notes (Signed)
Chief Complaint: left foot pain and swelling in the heel of the foot. No known injuries or fall. No history of foot problems. Pain in the foot is aching and tender when walking.   Onset: Saturday morning   Prescriptions or OTC medications tried: Yes- Ibuprofen     with no relief

## 2022-05-13 ENCOUNTER — Other Ambulatory Visit: Payer: Self-pay

## 2022-05-13 DIAGNOSIS — E785 Hyperlipidemia, unspecified: Secondary | ICD-10-CM

## 2022-05-13 MED ORDER — ATORVASTATIN CALCIUM 20 MG PO TABS: 20.0000 mg | ORAL_TABLET | Freq: Every day | ORAL | 0 refills | Status: DC

## 2022-05-13 NOTE — ED Provider Notes (Signed)
[facility]    CSN: [id number] Arrival date & time: 05/12/22  1828      History   Chief Complaint Chief Complaint  Patient presents with   Foot Pain    HPI Jasmine Krause is a 37 y.o. female comes to the urgent care with left foot pain and swelling which started a few days ago.  Patient's job requires a lot of walking.  She denies any fall or trauma to her feet.  She says the pain is sharp and throbbing in nature.  Pain is mainly in the heel area and around the medial side of the ankle.  No numbness or tingling.  No redness of the left ankle.  No fever or chills.  No radiation of pain.  Patient has taken some NSAIDs with partial relief.  She has not changed her shoes recently. HPI  Past Medical History:  Diagnosis Date   Abnormal Pap smear of cervix    09-23-20 neg HPV HR+   High cholesterol    Obesity     Patient Active Problem List   Diagnosis Date Noted   Cholecystitis 10/18/2021   Prediabetes 06/18/2020   Hyperlipidemia 06/18/2020   Morbid obesity ([place]) 05/15/2013    Past Surgical History:  Procedure Laterality Date   CHOLECYSTECTOMY N/A 10/19/2021   Procedure: LAPAROSCOPIC CHOLECYSTECTOMY;  Surgeon: Dwan Bolt, MD;  Location: [facility];  Service: General;  Laterality: N/A;    OB History     Gravida  1   Para  1   Term  1   Preterm  0   AB  0   Living  1      SAB  0   IAB  0   Ectopic  0   Multiple  0   Live Births  1            Home Medications    Prior to Admission medications   Medication Sig Start Date End Date Taking? Authorizing Provider  Dulaglutide (TRULICITY) 1.5 VO/1.6WV SOPN Inject 1.5 mg into the skin once a week. 03/28/22  Yes Charlott Rakes, MD  ibuprofen (ADVIL) 600 MG tablet Take 1 tablet (600 mg total) by mouth every 6 (six) hours as needed. 05/12/22  Yes Juno Bozard, Myrene Galas, MD  insulin glargine (LANTUS SOLOSTAR) 100 UNIT/ML Solostar Pen ADMINISTER 30 UNITS UNDER THE SKIN DAILY 03/16/22  Yes Camillia Herter,  NP  JUNEL FE 1.5/30 1.5-30 MG-MCG tablet TAKE 1 TABLET BY MOUTH DAILY 12/08/21  Yes Princess Bruins, MD  metFORMIN (GLUCOPHAGE-XR) 500 MG 24 hr tablet Take 2 tablets (1,000 mg total) by mouth daily with breakfast. 04/19/22 07/18/22 Yes Camillia Herter, NP  Accu-Chek Softclix Lancets lancets use as directed to check blood sugar 10/21/21   Norm Parcel, PA-C  acetaminophen (TYLENOL) 500 MG tablet Take 2 tablets (1,000 mg total) by mouth every 6 (six) hours as needed for mild pain or fever. 10/21/21   Norm Parcel, PA-C  atorvastatin (LIPITOR) 20 MG tablet Take 1 tablet (20 mg total) by mouth daily. 05/13/22   Camillia Herter, NP  blood glucose meter kit and supplies Use up to four times daily as directed. 10/21/21   Norm Parcel, PA-C  Continuous Blood Gluc Receiver (FREESTYLE LIBRE 2 READER) DEVI Use to check blood sugar TID. 12/03/21   Charlott Rakes, MD  Continuous Blood Gluc Sensor (FREESTYLE LIBRE 2 SENSOR) MISC Use to check blood sugar TID. 12/03/21   Charlott Rakes, MD  glucose  blood test strip use to check blood sugars up to four times daily 10/21/21   Norm Parcel, PA-C  Insulin Pen Needle (PENTIPS) 32G X 4 MM MISC Use to inject insulin as directed. 02/23/22   Camillia Herter, NP  mupirocin ointment (BACTROBAN) 2 % Place 1 Application into the nose 2 (two) times daily. 10/21/21   Norm Parcel, PA-C  polyethylene glycol powder (GLYCOLAX/MIRALAX) 17 GM/SCOOP powder Take 17 g by mouth daily as needed for mild constipation or moderate constipation. 03/16/22   Camillia Herter, NP  simethicone (MYLICON) 80 MG chewable tablet Chew 1 tablet (80 mg total) by mouth 4 (four) times daily as needed for flatulence. 10/21/21   Norm Parcel, PA-C    Family History Family History  Problem Relation Age of Onset   Diabetes Mother    Hypertension Mother    Diabetes Maternal Grandmother    Hypertension Maternal Grandmother    Other Neg Hx     Social History Social History   Tobacco Use    Smoking status: Former    Passive exposure: Past   Smokeless tobacco: Never  Vaping Use   Vaping Use: Former  Substance Use Topics   Alcohol use: Not Currently   Drug use: No     Allergies   Patient has no known allergies.   Review of Systems Review of Systems  Cardiovascular: Negative.   Gastrointestinal: Negative.   Genitourinary: Negative.   Musculoskeletal:  Positive for arthralgias, joint swelling and myalgias.  Skin: Negative.      Physical Exam Triage Vital Signs ED Triage Vitals  Enc Vitals Group     BP 05/12/22 1935 (!) 173/92     Pulse Rate 05/12/22 1935 91     Resp 05/12/22 1935 16     Temp 05/12/22 1935 98.9 F (37.2 C)     Temp Source 05/12/22 1935 Oral     SpO2 05/12/22 1935 100 %     Weight 05/12/22 1935 (!) 313 lb 0.9 oz (142 kg)     Height 05/12/22 1935 5' 7.99" (1.727 m)     Head Circumference --      Peak Flow --      Pain Score 05/12/22 1933 10     Pain Loc --      Pain Edu? --      Excl. in [place]? --    No data found.  Updated Vital Signs BP (!) 173/92 (BP Location: Right Wrist)   Pulse 91   Temp 98.9 F (37.2 C) (Oral)   Resp 16   Ht 5' 7.99" (1.727 m)   Wt (!) 142 kg   LMP  (LMP Unknown) Comment: Skipping mensus with oral contraceptives  SpO2 100%   BMI 47.61 kg/m   Visual Acuity Right Eye Distance:   Left Eye Distance:   Bilateral Distance:    Right Eye Near:   Left Eye Near:    Bilateral Near:     Physical Exam Vitals and nursing note reviewed.  Constitutional:      General: She is not in acute distress.    Appearance: She is not ill-appearing.  Cardiovascular:     Rate and Rhythm: Normal rate and regular rhythm.     Pulses: Normal pulses.     Heart sounds: Normal heart sounds.  Musculoskeletal:        General: Swelling and tenderness present. No deformity. Normal range of motion.     Comments: Mild swelling on the medial aspect  of the left heel.  No bruising.  Achilles tendon is not tender.  Skin:    General:  Skin is warm.  Neurological:     Mental Status: She is alert.      UC Treatments / Results  Labs (all labs ordered are listed, but only abnormal results are displayed) Labs Reviewed - No data to display  EKG   Radiology No results found.  Procedures Procedures (including critical care time)  Medications Ordered in UC Medications - No data to display  Initial Impression / Assessment and Plan / UC Course  I have reviewed the triage vital signs and the nursing notes.  Pertinent labs & imaging results that were available during my care of the patient were reviewed by me and considered in my medical decision making (see chart for details).     1.  Left foot pain: Shoe inserts advised NSAIDs as needed for pain Icing of the left ankle/left heel Gentle range of motion exercises No imaging indicated at this time Return precautions given. Final Clinical Impressions(s) / UC Diagnoses   Final diagnoses:  Foot pain, left     Discharge Instructions      Please get some shoe inserts to provide some cushion to your feet. Rest and elevate the affected painful area.   Apply cold compresses intermittently as needed.   Please take medications as prescribed As pain recedes, begin normal activities slowly as tolerated. Please return to urgent care if you have persistent pain or worsening pain.     ED Prescriptions     Medication Sig Dispense Auth. Provider   ibuprofen (ADVIL) 600 MG tablet Take 1 tablet (600 mg total) by mouth every 6 (six) hours as needed. 30 tablet Lindsey Demonte, Britta Mccreedy, MD      PDMP not reviewed this encounter.   Merrilee Jansky, MD 05/13/22 1524

## 2022-06-08 NOTE — Progress Notes (Signed)
Patient ID: Jasmine Krause, female    DOB: 01-27-1986  MRN: [id number]  CC: Chronic Care Management   Subjective: Jasmine Krause is a 37 y.o. female who presents for chronic care management.   Her concerns today include:  - Doing well on diabetes and cholesterol medications, no issues/concerns. She denies red flag symptoms.  - Requests refills on Ibuprofen 800 mg to assist with menstrual discomfort.  Patient Active Problem List   Diagnosis Date Noted   Cholecystitis 10/18/2021   Prediabetes 06/18/2020   Hyperlipidemia 06/18/2020   Morbid obesity ([place]) 05/15/2013     Current Outpatient Medications on File Prior to Visit  Medication Sig Dispense Refill   Accu-Chek Softclix Lancets lancets use as directed to check blood sugar 100 each 0   acetaminophen (TYLENOL) 500 MG tablet Take 2 tablets (1,000 mg total) by mouth every 6 (six) hours as needed for mild pain or fever.     atorvastatin (LIPITOR) 20 MG tablet Take 1 tablet (20 mg total) by mouth daily. 90 tablet 0   blood glucose meter kit and supplies Use up to four times daily as directed. 1 each 0   Continuous Blood Gluc Receiver (FREESTYLE LIBRE 2 READER) DEVI Use to check blood sugar TID. 1 each 0   Continuous Blood Gluc Sensor (FREESTYLE LIBRE 2 SENSOR) MISC Use to check blood sugar TID. 2 each 2   glucose blood test strip use to check blood sugars up to four times daily 100 each 0   Insulin Pen Needle (PENTIPS) 32G X 4 MM MISC Use to inject insulin as directed. 100 each 0   JUNEL FE 1.5/30 1.5-30 MG-MCG tablet TAKE 1 TABLET BY MOUTH DAILY 28 tablet 11   mupirocin ointment (BACTROBAN) 2 % Place 1 Application into the nose 2 (two) times daily. 22 g 0   polyethylene glycol powder (GLYCOLAX/MIRALAX) 17 GM/SCOOP powder Take 17 g by mouth daily as needed for mild constipation or moderate constipation. 3350 g 1   simethicone (MYLICON) 80 MG chewable tablet Chew 1 tablet (80 mg total) by mouth 4 (four) times daily as needed for  flatulence.     No current facility-administered medications on file prior to visit.    No Known Allergies  Social History   Socioeconomic History   Marital status: Single    Spouse name: n/a   Number of children: 0   Years of education: Associates   Highest education level: Not on file  Occupational History   Occupation: TODDLER Product manager: [employer]  Tobacco Use   Smoking status: Former    Passive exposure: Past   Smokeless tobacco: Never  Scientific laboratory technician Use: Former  Substance and Sexual Activity   Alcohol use: Not Currently   Drug use: No   Sexual activity: Not Currently    Partners: Male    Birth control/protection: Pill    Comment: intercourse age 86, less than 5 sexual pafrtners, CURRENT PARTNER- 83 YRS  Other Topics Concern   Not on file  Social History Narrative   Lives with her boyfriend and their daughter.   Social Determinants of Health   Financial Resource Strain: Not on file  Food Insecurity: Not on file  Transportation Needs: Not on file  Physical Activity: Not on file  Stress: Not on file  Social Connections: Not on file  Intimate Partner Violence: Not on file    Family History  Problem Relation Age of Onset   Diabetes Mother  Hypertension Mother    Diabetes Maternal Grandmother    Hypertension Maternal Grandmother    Other Neg Hx     Past Surgical History:  Procedure Laterality Date   CHOLECYSTECTOMY N/A 10/19/2021   Procedure: LAPAROSCOPIC CHOLECYSTECTOMY;  Surgeon: Dwan Bolt, MD;  Location: [place];  Service: General;  Laterality: N/A;    ROS: Review of Systems Negative except as stated above  PHYSICAL EXAM: BP 133/83 (BP Location: Left Arm, Patient Position: Sitting, Cuff Size: Large)   Pulse 94   Temp 98.3 F (36.8 C)   Resp 16   Ht 5' 7.99" (1.727 m)   Wt (!) 304 lb (137.9 kg)   SpO2 97%   BMI 46.23 kg/m   Physical Exam HENT:     Head: Normocephalic and atraumatic.  Eyes:     Extraocular  Movements: Extraocular movements intact.     Conjunctiva/sclera: Conjunctivae normal.     Pupils: Pupils are equal, round, and reactive to light.  Cardiovascular:     Rate and Rhythm: Normal rate and regular rhythm.     Pulses: Normal pulses.     Heart sounds: Normal heart sounds.  Pulmonary:     Effort: Pulmonary effort is normal.     Breath sounds: Normal breath sounds.  Musculoskeletal:     Cervical back: Normal range of motion and neck supple.  Neurological:     General: No focal deficit present.     Mental Status: She is alert and oriented to person, place, and time.  Psychiatric:        Mood and Affect: Mood normal.        Behavior: Behavior normal.    Results for orders placed or performed in visit on 06/15/22  POCT glycosylated hemoglobin (Hb A1C)  Result Value Ref Range   Hemoglobin A1C     HbA1c POC (<> result, manual entry)     HbA1c, POC (prediabetic range)     HbA1c, POC (controlled diabetic range) 6.9 0.0 - 7.0 %    ASSESSMENT AND PLAN: 1. Type 2 diabetes mellitus without complication, with long-term current use of insulin (HCC) - Hemoglobin A1c at goal at 6.9%, goal 7%. This is improved from previous 7.3%.  - Continue Metformin, Insulin Glargine, and Dulaglutide as prescribed.  - Routine screening.  - Discussed the importance of healthy eating habits, low-carbohydrate diet, low-sugar diet, regular aerobic exercise (at least 150 minutes a week as tolerated) and medication compliance to achieve or maintain control of diabetes. - Follow-up with primary provider in 3 months or sooner if needed.  - Basic Metabolic Panel - POCT glycosylated hemoglobin (Hb A1C) - metFORMIN (GLUCOPHAGE-XR) 500 MG 24 hr tablet; Take 2 tablets (1,000 mg total) by mouth daily with breakfast.  Dispense: 60 tablet; Refill: 2 - Dulaglutide (TRULICITY) 1.5 0000000 SOPN; Inject 1.5 mg into the skin once a week.  Dispense: 2 mL; Refill: 2 - insulin glargine (LANTUS SOLOSTAR) 100 UNIT/ML  Solostar Pen; ADMINISTER 30 UNITS UNDER THE SKIN DAILY  Dispense: 15 mL; Refill: 2  2. Hyperlipidemia, unspecified hyperlipidemia type - Continue Atorvastatin as prescribed. No refills needed as of present.  - Follow-up with primary provider in 3 months or sooner if needed.   3. Dysmenorrhea - Continue Ibuprofen as prescribed.  - Follow-up with primary provider as scheduled.  - ibuprofen (ADVIL) 800 MG tablet; Take 1 tablet (800 mg total) by mouth every 8 (eight) hours as needed.  Dispense: 30 tablet; Refill: 2   Patient was given the opportunity  to ask questions.  Patient verbalized understanding of the plan and was able to repeat key elements of the plan. Patient was given clear instructions to go to Emergency Department or return to medical center if symptoms don't improve, worsen, or new problems develop.The patient verbalized understanding.   Orders Placed This Encounter  Procedures   Basic Metabolic Panel   POCT glycosylated hemoglobin (Hb A1C)     Requested Prescriptions   Signed Prescriptions Disp Refills   metFORMIN (GLUCOPHAGE-XR) 500 MG 24 hr tablet 60 tablet 2    Sig: Take 2 tablets (1,000 mg total) by mouth daily with breakfast.   Dulaglutide (TRULICITY) 1.5 0000000 SOPN 2 mL 2    Sig: Inject 1.5 mg into the skin once a week.   insulin glargine (LANTUS SOLOSTAR) 100 UNIT/ML Solostar Pen 15 mL 2    Sig: ADMINISTER 30 UNITS UNDER THE SKIN DAILY   ibuprofen (ADVIL) 800 MG tablet 30 tablet 2    Sig: Take 1 tablet (800 mg total) by mouth every 8 (eight) hours as needed.    Return in about 3 months (around 09/13/2022) for Follow-Up or next available chronic care mgmt .  Camillia Herter, NP

## 2022-06-15 ENCOUNTER — Ambulatory Visit (INDEPENDENT_AMBULATORY_CARE_PROVIDER_SITE_OTHER): Payer: No Typology Code available for payment source | Admitting: Family

## 2022-06-15 ENCOUNTER — Encounter: Payer: Self-pay | Admitting: Family

## 2022-06-15 VITALS — BP 133/83 | HR 94 | Temp 98.3°F | Resp 16 | Ht 67.99 in | Wt 304.0 lb

## 2022-06-15 DIAGNOSIS — N946 Dysmenorrhea, unspecified: Secondary | ICD-10-CM

## 2022-06-15 DIAGNOSIS — E785 Hyperlipidemia, unspecified: Secondary | ICD-10-CM | POA: Diagnosis not present

## 2022-06-15 DIAGNOSIS — E119 Type 2 diabetes mellitus without complications: Secondary | ICD-10-CM | POA: Diagnosis not present

## 2022-06-15 DIAGNOSIS — Z794 Long term (current) use of insulin: Secondary | ICD-10-CM

## 2022-06-15 LAB — POCT GLYCOSYLATED HEMOGLOBIN (HGB A1C): HbA1c, POC (controlled diabetic range): 6.9 % (ref 0.0–7.0)

## 2022-06-15 MED ORDER — IBUPROFEN 800 MG PO TABS: 800.0000 mg | ORAL_TABLET | Freq: Three times a day (TID) | ORAL | 2 refills | Status: DC | PRN

## 2022-06-15 MED ORDER — TRULICITY 1.5 MG/0.5ML ~~LOC~~ SOAJ: 1.5000 mg | SUBCUTANEOUS | 2 refills | Status: DC

## 2022-06-15 MED ORDER — LANTUS SOLOSTAR 100 UNIT/ML ~~LOC~~ SOPN: PEN_INJECTOR | SUBCUTANEOUS | 2 refills | Status: DC

## 2022-06-15 MED ORDER — METFORMIN HCL ER 500 MG PO TB24: 1000.0000 mg | ORAL_TABLET | Freq: Every day | ORAL | 2 refills | Status: DC

## 2022-06-15 NOTE — Progress Notes (Signed)
.  Pt presents for chronic care management  -req refill on Ibuprofen 853m

## 2022-06-16 LAB — BASIC METABOLIC PANEL
BUN/Creatinine Ratio: 15 (ref 9–23)
BUN: 8 mg/dL (ref 6–20)
CO2: 22 mmol/L (ref 20–29)
Calcium: 9.4 mg/dL (ref 8.7–10.2)
Chloride: 105 mmol/L (ref 96–106)
Creatinine, Ser: 0.55 mg/dL — ABNORMAL LOW (ref 0.57–1.00)
Glucose: 129 mg/dL — ABNORMAL HIGH (ref 70–99)
Potassium: 4.5 mmol/L (ref 3.5–5.2)
Sodium: 141 mmol/L (ref 134–144)
eGFR: 123 mL/min/{1.73_m2} (ref >59)

## 2022-07-28 ENCOUNTER — Other Ambulatory Visit (HOSPITAL_COMMUNITY): Payer: Self-pay

## 2022-08-01 ENCOUNTER — Other Ambulatory Visit: Payer: Self-pay | Admitting: Family

## 2022-08-01 DIAGNOSIS — E785 Hyperlipidemia, unspecified: Secondary | ICD-10-CM

## 2022-08-02 NOTE — Telephone Encounter (Signed)
Requested Prescriptions  Pending Prescriptions Disp Refills   atorvastatin (LIPITOR) 20 MG tablet [Pharmacy Med Name: ATORVASTATIN  TABLETS] 90 tablet 1    Sig: TAKE 1 TABLET(20 MG) BY MOUTH DAILY     Cardiovascular:  Antilipid - Statins Failed - 08/01/2022  3:18 AM      Failed - Lipid Panel in normal range within the last 12 months    Cholesterol, Total  Date Value Ref Range Status  12/14/2021 176 100 - 199 mg/dL Final   LDL Chol Calc (NIH)  Date Value Ref Range Status  12/14/2021 107 (H) 0 - 99 mg/dL Final   HDL  Date Value Ref Range Status  12/14/2021 52 >39 mg/dL Final   Triglycerides  Date Value Ref Range Status  12/14/2021 92 0 - 149 mg/dL Final         Passed - Patient is not pregnant      Passed - Valid encounter within last 12 months    Recent Outpatient Visits           1 month ago Type 2 diabetes mellitus without complication, with long-term current use of insulin (HCC)   [facility], Amy J, NP   4 months ago Type 2 diabetes mellitus without complication, without long-term current use of insulin [facility])   [facility] Elida, Dalton L, RPH-CPP   4 months ago Type 2 diabetes mellitus without complication, with long-term current use of insulin (HCC)   [facility], Amy J, NP   5 months ago Type 2 diabetes mellitus without complication, without long-term current use of insulin [facility])   [facility] Harrold, [place] L, RPH-CPP   6 months ago Type 2 diabetes mellitus without complication, without long-term current use of insulin [facility])    [facility] Drucilla Chalet, RPH-CPP       Future Appointments             In 1 month Rema Fendt, NP [facility]

## 2022-09-05 NOTE — Progress Notes (Addendum)
Patient ID: Jasmine Krause, female    DOB: [birth year]  MRN: [id number]  CC: Chronic Care Management   Subjective: Jasmine Krause is a 37 y.o. female who presents for chronic care management.  Her concerns today include:  - Doing well on Metformin, Insulin, and Trulicity, no issues/concerns. Home blood sugars 100's. States she plans to begin monitoring what she eats. She denies red flag symptoms associated with diabetes.  - Doing well on Atorvastatin, no issues/concerns.  - No further issues/concerns for discussion today.  Patient Active Problem List   Diagnosis Date Noted   Cholecystitis 10/18/2021   Prediabetes 06/18/2020   Hyperlipidemia 06/18/2020   Morbid obesity (HCC) 05/15/2013     Current Outpatient Medications on File Prior to Visit  Medication Sig Dispense Refill   Accu-Chek Softclix Lancets lancets use as directed to check blood sugar 100 each 0   acetaminophen (TYLENOL) 500 MG tablet Take 2 tablets (1,000 mg total) by mouth every 6 (six) hours as needed for mild pain or fever.     blood glucose meter kit and supplies Use up to four times daily as directed. 1 each 0   Continuous Blood Gluc Receiver (FREESTYLE LIBRE 2 READER) DEVI Use to check blood sugar TID. 1 each 0   Continuous Blood Gluc Sensor (FREESTYLE LIBRE 2 SENSOR) MISC Use to check blood sugar TID. 2 each 2   glucose blood test strip use to check blood sugars up to four times daily 100 each 0   ibuprofen (ADVIL) 800 MG tablet Take 1 tablet (800 mg total) by mouth every 8 (eight) hours as needed. 30 tablet 2   Insulin Pen Needle (PENTIPS) 32G X 4 MM MISC Use to inject insulin as directed. 100 each 0   JUNEL FE 1.5/30 1.5-30 MG-MCG tablet TAKE 1 TABLET BY MOUTH DAILY 28 tablet 11   mupirocin ointment (BACTROBAN) 2 % Place 1 Application into the nose 2 (two) times daily. 22 g 0   polyethylene glycol powder (GLYCOLAX/MIRALAX) 17 GM/SCOOP powder Take 17 g by mouth daily as needed for mild constipation or moderate  constipation. 3350 g 1   simethicone (MYLICON) 80 MG chewable tablet Chew 1 tablet (80 mg total) by mouth 4 (four) times daily as needed for flatulence.     No current facility-administered medications on file prior to visit.    No Known Allergies  Social History   Socioeconomic History   Marital status: Single    Spouse name: n/a   Number of children: 0   Years of education: Associates   Highest education level: Associate degree: academic program  Occupational History   Occupation: TODDLER Magazine features editor: [employer]  Tobacco Use   Smoking status: Former    Passive exposure: Past   Smokeless tobacco: Never  Building services engineer Use: Former  Substance and Sexual Activity   Alcohol use: Not Currently   Drug use: No   Sexual activity: Not Currently    Partners: Male    Birth control/protection: Pill    Comment: intercourse age 62, less than 5 sexual pafrtners, CURRENT PARTNER- 9 YRS  Other Topics Concern   Not on file  Social History Narrative   Lives with her boyfriend and their daughter.   Social Determinants of Health   Financial Resource Strain: High Risk (09/09/2022)   Overall Financial Resource Strain (CARDIA)    Difficulty of Paying Living Expenses: Very hard  Food Insecurity: Food Insecurity Present (09/09/2022)   Hunger  Vital Sign    Worried About Programme researcher, broadcasting/film/video in the Last Year: Sometimes true    Ran Out of Food in the Last Year: Sometimes true  Transportation Needs: No Transportation Needs (09/09/2022)   PRAPARE - Administrator, Civil Service (Medical): No    Lack of Transportation (Non-Medical): No  Physical Activity: Unknown (09/09/2022)   Exercise Vital Sign    Days of Exercise per Week: 0 days    Minutes of Exercise per Session: Not on file  Stress: Stress Concern Present (09/09/2022)   [employer] of Occupational Health - Occupational Stress Questionnaire    Feeling of Stress : Rather much  Social Connections: Socially  Isolated (09/09/2022)   Social Connection and Isolation Panel [NHANES]    Frequency of Communication with Friends and Family: Three times a week    Frequency of Social Gatherings with Friends and Family: Never    Attends Religious Services: Never    Database administrator or Organizations: No    Attends Engineer, structural: Not on file    Marital Status: Never married  Catering manager Violence: Not on file    Family History  Problem Relation Age of Onset   Diabetes Mother    Hypertension Mother    Diabetes Maternal Grandmother    Hypertension Maternal Grandmother    Other Neg Hx     Past Surgical History:  Procedure Laterality Date   CHOLECYSTECTOMY N/A 10/19/2021   Procedure: LAPAROSCOPIC CHOLECYSTECTOMY;  Surgeon: Fritzi Mandes, MD;  Location: [facility];  Service: General;  Laterality: N/A;    ROS: Review of Systems Negative except as stated above  PHYSICAL EXAM: BP 135/84 (BP Location: Left Arm, Patient Position: Sitting, Cuff Size: Large)   Pulse (!) 105   Temp 98.6 F (37 C)   Resp 18   Ht 5\' 7"  (1.702 m)   Wt (!) 309 lb (140.2 kg)   SpO2 98%   BMI 48.40 kg/m   Physical Exam HENT:     Head: Atraumatic.  Eyes:     Extraocular Movements: Extraocular movements intact.     Conjunctiva/sclera: Conjunctivae normal.     Pupils: Pupils are equal, round, and reactive to light.  Cardiovascular:     Rate and Rhythm: Tachycardia present.     Pulses: Normal pulses.     Heart sounds: Normal heart sounds.  Pulmonary:     Effort: Pulmonary effort is normal.     Breath sounds: Normal breath sounds.  Musculoskeletal:     Cervical back: Normal range of motion and neck supple.  Neurological:     General: No focal deficit present.     Mental Status: She is alert and oriented to person, place, and time.  Psychiatric:        Mood and Affect: Mood normal.        Behavior: Behavior normal.     Results for orders placed or performed in visit on 09/13/22  POCT  glycosylated hemoglobin (Hb A1C)  Result Value Ref Range   Hemoglobin A1C     HbA1c POC (<> result, manual entry)     HbA1c, POC (prediabetic range)     HbA1c, POC (controlled diabetic range) 6.7 0.0 - 7.0 %    ASSESSMENT AND PLAN: 1. Type 2 diabetes mellitus without complication, with long-term current use of insulin (HCC) - Hemoglobin A1c at goal at 6.7%, goal 7%. This is improved compared to previous 6.9%. - Continue Metformin, Insulin Glargine, and Dulaglutide  as prescribed. - Discussed the importance of healthy eating habits, low-carbohydrate diet, low-sugar diet, regular aerobic exercise (at least 150 minutes a week as tolerated) and medication compliance to achieve or maintain control of diabetes. - Follow-up with primary provider in 3 months or sooner if needed.  - POCT glycosylated hemoglobin (Hb A1C) - metFORMIN (GLUCOPHAGE-XR) 500 MG 24 hr tablet; Take 2 tablets (1,000 mg total) by mouth daily with breakfast.  Dispense: 180 tablet; Refill: 0 - insulin glargine (LANTUS SOLOSTAR) 100 UNIT/ML Solostar Pen; ADMINISTER 30 UNITS UNDER THE SKIN DAILY  Dispense: 15 mL; Refill: 2 - Dulaglutide (TRULICITY) 1.5 MG/0.5ML SOPN; Inject 1.5 mg into the skin once a week.  Dispense: 7 mL; Refill: 0  2. Hyperlipidemia, unspecified hyperlipidemia type - Continue Atorvastatin as prescribed.  - Follow-up with primary provider in 3 months or sooner if needed.  - atorvastatin (LIPITOR) 20 MG tablet; TAKE 1 TABLET(20 MG) BY MOUTH DAILY  Dispense: 90 tablet; Refill: 0   Patient was given the opportunity to ask questions.  Patient verbalized understanding of the plan and was able to repeat key elements of the plan. Patient was given clear instructions to go to Emergency Department or return to medical center if symptoms don't improve, worsen, or new problems develop.The patient verbalized understanding.   Orders Placed This Encounter  Procedures   POCT glycosylated hemoglobin (Hb A1C)      Requested Prescriptions   Signed Prescriptions Disp Refills   metFORMIN (GLUCOPHAGE-XR) 500 MG 24 hr tablet 180 tablet 0    Sig: Take 2 tablets (1,000 mg total) by mouth daily with breakfast.   insulin glargine (LANTUS SOLOSTAR) 100 UNIT/ML Solostar Pen 15 mL 2    Sig: ADMINISTER 30 UNITS UNDER THE SKIN DAILY   Dulaglutide (TRULICITY) 1.5 MG/0.5ML SOPN 7 mL 0    Sig: Inject 1.5 mg into the skin once a week.   atorvastatin (LIPITOR) 20 MG tablet 90 tablet 0    Sig: TAKE 1 TABLET(20 MG) BY MOUTH DAILY    Return in about 3 months (around 12/14/2022) for Follow-Up or next available chronic care mgmt .  Rema Fendt, NP

## 2022-09-13 ENCOUNTER — Encounter: Payer: Self-pay | Admitting: Family

## 2022-09-13 ENCOUNTER — Ambulatory Visit (INDEPENDENT_AMBULATORY_CARE_PROVIDER_SITE_OTHER): Payer: No Typology Code available for payment source | Admitting: Family

## 2022-09-13 VITALS — BP 135/84 | HR 105 | Temp 98.6°F | Resp 18 | Ht 67.0 in | Wt 309.0 lb

## 2022-09-13 DIAGNOSIS — E119 Type 2 diabetes mellitus without complications: Secondary | ICD-10-CM

## 2022-09-13 DIAGNOSIS — E785 Hyperlipidemia, unspecified: Secondary | ICD-10-CM | POA: Diagnosis not present

## 2022-09-13 DIAGNOSIS — Z794 Long term (current) use of insulin: Secondary | ICD-10-CM | POA: Diagnosis not present

## 2022-09-13 LAB — POCT GLYCOSYLATED HEMOGLOBIN (HGB A1C): HbA1c, POC (controlled diabetic range): 6.7 % (ref 0.0–7.0)

## 2022-09-13 MED ORDER — LANTUS SOLOSTAR 100 UNIT/ML ~~LOC~~ SOPN
PEN_INJECTOR | SUBCUTANEOUS | 2 refills | Status: DC
Start: 2022-09-13 — End: 2022-12-16

## 2022-09-13 MED ORDER — TRULICITY 1.5 MG/0.5ML ~~LOC~~ SOAJ
1.5000 mg | SUBCUTANEOUS | 0 refills | Status: DC
Start: 2022-09-13 — End: 2022-12-12

## 2022-09-13 MED ORDER — METFORMIN HCL ER 500 MG PO TB24: 1000.0000 mg | ORAL_TABLET | Freq: Every day | ORAL | 0 refills | Status: DC

## 2022-09-13 MED ORDER — ATORVASTATIN CALCIUM 20 MG PO TABS
ORAL_TABLET | ORAL | 0 refills | Status: DC
Start: 2022-09-13 — End: 2023-03-21

## 2022-09-13 NOTE — Progress Notes (Signed)
Pt is here for chronic care mgt    

## 2022-10-25 ENCOUNTER — Other Ambulatory Visit: Payer: Self-pay | Admitting: Obstetrics & Gynecology

## 2022-10-25 DIAGNOSIS — Z3041 Encounter for surveillance of contraceptive pills: Secondary | ICD-10-CM

## 2022-10-25 NOTE — Telephone Encounter (Signed)
Medication refill request: Junel fe  Last AEX:  11/15/21 Next AEX: 11/17/22 Last MMG (if hormonal medication request): n/a Refill authorized: #[id number] with 0 rf pended for today

## 2022-11-17 ENCOUNTER — Ambulatory Visit: Payer: No Typology Code available for payment source | Admitting: Obstetrics & Gynecology

## 2022-12-12 ENCOUNTER — Other Ambulatory Visit: Payer: Self-pay

## 2022-12-12 DIAGNOSIS — E119 Type 2 diabetes mellitus without complications: Secondary | ICD-10-CM

## 2022-12-12 MED ORDER — TRULICITY 1.5 MG/0.5ML ~~LOC~~ SOAJ
1.5000 mg | SUBCUTANEOUS | 0 refills | Status: DC
Start: 2022-12-12 — End: 2023-03-21

## 2022-12-16 ENCOUNTER — Encounter: Payer: Self-pay | Admitting: Family

## 2022-12-16 ENCOUNTER — Ambulatory Visit: Payer: No Typology Code available for payment source | Admitting: Family

## 2022-12-16 VITALS — BP 137/89 | HR 99 | Temp 98.9°F | Ht 69.5 in | Wt 317.2 lb

## 2022-12-16 DIAGNOSIS — E1165 Type 2 diabetes mellitus with hyperglycemia: Secondary | ICD-10-CM | POA: Diagnosis not present

## 2022-12-16 DIAGNOSIS — E119 Type 2 diabetes mellitus without complications: Secondary | ICD-10-CM

## 2022-12-16 DIAGNOSIS — N63 Unspecified lump in unspecified breast: Secondary | ICD-10-CM

## 2022-12-16 DIAGNOSIS — Z803 Family history of malignant neoplasm of breast: Secondary | ICD-10-CM | POA: Diagnosis not present

## 2022-12-16 DIAGNOSIS — E785 Hyperlipidemia, unspecified: Secondary | ICD-10-CM | POA: Diagnosis not present

## 2022-12-16 DIAGNOSIS — Z794 Long term (current) use of insulin: Secondary | ICD-10-CM | POA: Diagnosis not present

## 2022-12-16 LAB — POCT GLYCOSYLATED HEMOGLOBIN (HGB A1C): HbA1c, POC (controlled diabetic range): 7 % (ref 0.0–7.0)

## 2022-12-16 MED ORDER — LANTUS SOLOSTAR 100 UNIT/ML ~~LOC~~ SOPN: PEN_INJECTOR | SUBCUTANEOUS | 2 refills | Status: DC

## 2022-12-16 MED ORDER — METFORMIN HCL ER 500 MG PO TB24
1000.0000 mg | ORAL_TABLET | Freq: Every day | ORAL | 0 refills | Status: DC
Start: 2022-12-16 — End: 2023-03-21

## 2022-12-16 MED ORDER — ATORVASTATIN CALCIUM 20 MG PO TABS
20.0000 mg | ORAL_TABLET | Freq: Every day | ORAL | 0 refills | Status: DC
Start: 2022-12-16 — End: 2022-12-27

## 2022-12-16 NOTE — Progress Notes (Signed)
Pt states metformin she hasn't really been taking it, states she has been taking the trulicity shot.   Pt states she has a bump n her boob she wants to talk about.

## 2022-12-16 NOTE — Progress Notes (Signed)
Patient ID: Jasmine Krause, female    DOB: September 26, 1985  MRN: [id number]  CC: Chronic Conditions Follow-Up  Subjective: Jasmine Krause is a 37 y.o. female who presents for  Her concerns today include:  - Doing well on Insulin Glargine and Trulicity, no issues/concerns. Reports she has not taken Metformin XR in 1.5 weeks due to states she doesn't feel like she needs it because blood sugars low 100's. She denies red flag symptoms associated with diabetes. - Doing well on Atorvastatin, no issues/concerns.  - Reports right breast lump for 1 month. She denies associated red flag symptoms. Reports family history of breast cancer of grandmother. She declines breast exam.  Patient Active Problem List   Diagnosis Date Noted   Cholecystitis 10/18/2021   Prediabetes 06/18/2020   Hyperlipidemia 06/18/2020   Morbid obesity (HCC) 05/15/2013     Current Outpatient Medications on File Prior to Visit  Medication Sig Dispense Refill   Accu-Chek Softclix Lancets lancets use as directed to check blood sugar 100 each 0   atorvastatin (LIPITOR) 20 MG tablet TAKE 1 TABLET(20 MG) BY MOUTH DAILY 90 tablet 0   blood glucose meter kit and supplies Use up to four times daily as directed. 1 each 0   Continuous Blood Gluc Receiver (FREESTYLE LIBRE 2 READER) DEVI Use to check blood sugar TID. 1 each 0   Continuous Blood Gluc Sensor (FREESTYLE LIBRE 2 SENSOR) MISC Use to check blood sugar TID. 2 each 2   Dulaglutide (TRULICITY) 1.5 MG/0.5ML SOPN Inject 1.5 mg into the skin once a week. 7 mL 0   glucose blood test strip use to check blood sugars up to four times daily 100 each 0   ibuprofen (ADVIL) 800 MG tablet Take 1 tablet (800 mg total) by mouth every 8 (eight) hours as needed. 30 tablet 2   Insulin Pen Needle (PENTIPS) 32G X 4 MM MISC Use to inject insulin as directed. 100 each 0   JUNEL FE 1.5/30 1.5-30 MG-MCG tablet TAKE 1 TABLET BY MOUTH DAILY 84 tablet 0   polyethylene glycol powder (GLYCOLAX/MIRALAX) 17  GM/SCOOP powder Take 17 g by mouth daily as needed for mild constipation or moderate constipation. 3350 g 1   acetaminophen (TYLENOL) 500 MG tablet Take 2 tablets (1,000 mg total) by mouth every 6 (six) hours as needed for mild pain or fever. (Patient not taking: Reported on 12/16/2022)     mupirocin ointment (BACTROBAN) 2 % Place 1 Application into the nose 2 (two) times daily. (Patient not taking: Reported on 12/16/2022) 22 g 0   simethicone (MYLICON) 80 MG chewable tablet Chew 1 tablet (80 mg total) by mouth 4 (four) times daily as needed for flatulence. (Patient not taking: Reported on 12/16/2022)     No current facility-administered medications on file prior to visit.    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   Social Determinants of Health   Financial Resource Strain: High Risk (09/09/2022)   Overall Financial Resource Strain (CARDIA)    Difficulty of Paying Living Expenses: Very hard  Food Insecurity: Food Insecurity Present (09/09/2022)   Hunger Vital Sign    Worried About Running Out of Food in the Last Year: Sometimes true    Ran Out of Food in the Last Year: Sometimes true  Transportation Needs: No Transportation Needs  (09/09/2022)   PRAPARE - Administrator, Civil Service (Medical): No    Lack of Transportation (Non-Medical): No  Physical Activity: Unknown (09/09/2022)   Exercise Vital Sign    Days of Exercise per Week: 0 days    Minutes of Exercise per Session: Not on file  Stress: Stress Concern Present (09/09/2022)   [employer] of Occupational Health - Occupational Stress Questionnaire    Feeling of Stress : Rather much  Social Connections: Socially Isolated (09/09/2022)   Social Connection and Isolation Panel [NHANES]    Frequency of Communication with Friends and Family: Three times a week    Frequency of Social Gatherings with Friends and Family: Never    Attends Religious Services: Never    Database administrator or Organizations: No    Attends Engineer, structural: Not on file    Marital Status: Never married  Catering manager Violence: Not on file    Family History  Problem Relation Age of Onset   Diabetes Mother    Hypertension Mother    Diabetes Maternal Grandmother    Hypertension Maternal Grandmother    Other Neg Hx     Past Surgical History:  Procedure Laterality Date   CHOLECYSTECTOMY N/A 10/19/2021   Procedure: LAPAROSCOPIC CHOLECYSTECTOMY;  Surgeon: Fritzi Mandes, MD;  Location: [facility] OR;  Service: General;  Laterality: N/A;    ROS: Review of Systems Negative except as stated above  PHYSICAL EXAM: BP 137/89   Pulse 99   Temp 98.9 F (37.2 C) (Oral)   Ht 5' 9.5" (1.765 m)   Wt (!) 317 lb 3.2 oz (143.9 kg)   SpO2 97%   BMI 46.17 kg/m   Physical Exam HENT:     Head: Normocephalic and atraumatic.     Nose: Nose normal.     Mouth/Throat:     Mouth: Mucous membranes are moist.     Pharynx: Oropharynx is clear.  Eyes:     Extraocular Movements: Extraocular movements intact.     Conjunctiva/sclera: Conjunctivae normal.     Pupils: Pupils are equal, round, and reactive to light.  Cardiovascular:     Rate and Rhythm: Normal rate and regular  rhythm.     Pulses: Normal pulses.     Heart sounds: Normal heart sounds.  Pulmonary:     Effort: Pulmonary effort is normal.     Breath sounds: Normal breath sounds.  Musculoskeletal:        General: Normal range of motion.     Cervical back: Normal range of motion and neck supple.  Neurological:     General: No focal deficit present.     Mental Status: She is alert and oriented to person, place, and time.  Psychiatric:        Mood and Affect: Mood normal.  Behavior: Behavior normal.     ASSESSMENT AND PLAN: 1. Type 2 diabetes mellitus with hyperglycemia, with long-term current use of insulin (HCC) - Hemoglobin A1c at goal at 7.0%, goal 7%.  - Continue Metformin XR and Insulin Glargine as prescribed. - Continue Dulaglutide as prescribed. No refills needed as of present. - Routine screening.  - Discussed the importance of healthy eating habits, low-carbohydrate diet, low-sugar diet, regular aerobic exercise (at least 150 minutes a week as tolerated) and medication compliance to achieve or maintain control of diabetes. - Follow-up with primary provider in 3 months or sooner if needed.  - Basic Metabolic Panel - Microalbumin / creatinine urine ratio - insulin glargine (LANTUS SOLOSTAR) 100 UNIT/ML Solostar Pen; ADMINISTER 30 UNITS UNDER THE SKIN DAILY  Dispense: 15 mL; Refill: 2 - metFORMIN (GLUCOPHAGE-XR) 500 MG 24 hr tablet; Take 2 tablets (1,000 mg total) by mouth daily with breakfast.  Dispense: 180 tablet; Refill: 0 - POCT glycosylated hemoglobin (Hb A1C); Future  2. Hyperlipidemia, unspecified hyperlipidemia type - Continue Atorvastatin as prescribed.  - Routine screening.  - Follow-up with primary provider as scheduled. - Lipid panel - atorvastatin (LIPITOR) 20 MG tablet; Take 1 tablet (20 mg total) by mouth daily.  Dispense: 90 tablet; Refill: 0  3. Diabetic eye exam [facility]) - Referral to Ophthalmology for further evaluation/management. - Ambulatory referral to  Ophthalmology  4. Encounter for diabetic foot exam [facility]) - Referral to Podiatry for further evaluation/management. - Ambulatory referral to Podiatry  5. Family history of breast cancer 6. Breast nodule - Routine screening.  - MM Digital Screening; Future    Patient was given the opportunity to ask questions.  Patient verbalized understanding of the plan and was able to repeat key elements of the plan. Patient was given clear instructions to go to Emergency Department or return to medical center if symptoms don't improve, worsen, or new problems develop.The patient verbalized understanding.   Orders Placed This Encounter  Procedures   MM Digital Screening   Lipid panel   Basic Metabolic Panel   Microalbumin / creatinine urine ratio   Ambulatory referral to Ophthalmology   Ambulatory referral to Podiatry   POCT glycosylated hemoglobin (Hb A1C)     Requested Prescriptions   Signed Prescriptions Disp Refills   insulin glargine (LANTUS SOLOSTAR) 100 UNIT/ML Solostar Pen 15 mL 2    Sig: ADMINISTER 30 UNITS UNDER THE SKIN DAILY   atorvastatin (LIPITOR) 20 MG tablet 90 tablet 0    Sig: Take 1 tablet (20 mg total) by mouth daily.   metFORMIN (GLUCOPHAGE-XR) 500 MG 24 hr tablet 180 tablet 0    Sig: Take 2 tablets (1,000 mg total) by mouth daily with breakfast.    Return in about 3 months (around 03/18/2023) for Follow-Up or next available chronic conditions.  Rema Fendt, NP

## 2022-12-17 LAB — LIPID PANEL
Chol/HDL Ratio: 2.8 ratio (ref 0.0–4.4)
Cholesterol, Total: 175 mg/dL (ref 100–199)
HDL: 62 mg/dL (ref >39)
LDL Chol Calc (NIH): 100 mg/dL — ABNORMAL HIGH (ref 0–99)
Triglycerides: 71 mg/dL (ref 0–149)
VLDL Cholesterol Cal: 13 mg/dL (ref 5–40)

## 2022-12-17 LAB — BASIC METABOLIC PANEL
BUN/Creatinine Ratio: 13 (ref 9–23)
BUN: 7 mg/dL (ref 6–20)
CO2: 17 mmol/L — ABNORMAL LOW (ref 20–29)
Calcium: 9.1 mg/dL (ref 8.7–10.2)
Chloride: 102 mmol/L (ref 96–106)
Creatinine, Ser: 0.54 mg/dL — ABNORMAL LOW (ref 0.57–1.00)
Glucose: 132 mg/dL — ABNORMAL HIGH (ref 70–99)
Potassium: 4.5 mmol/L (ref 3.5–5.2)
Sodium: 139 mmol/L (ref 134–144)
eGFR: 124 mL/min/{1.73_m2} (ref >59)

## 2022-12-27 ENCOUNTER — Ambulatory Visit (INDEPENDENT_AMBULATORY_CARE_PROVIDER_SITE_OTHER): Payer: No Typology Code available for payment source | Admitting: Radiology

## 2022-12-27 ENCOUNTER — Other Ambulatory Visit (HOSPITAL_COMMUNITY)
Admission: RE | Admit: 2022-12-27 | Discharge: 2022-12-27 | Disposition: A | Discharge status: Home / Self Care | Payer: No Typology Code available for payment source | Source: Ambulatory Visit | Attending: Radiology | Admitting: Radiology

## 2022-12-27 ENCOUNTER — Encounter: Payer: Self-pay | Admitting: Radiology

## 2022-12-27 VITALS — BP 162/80 | HR 78 | Resp 14 | Ht 68.25 in | Wt 314.0 lb

## 2022-12-27 DIAGNOSIS — Z113 Encounter for screening for infections with a predominantly sexual mode of transmission: Secondary | ICD-10-CM

## 2022-12-27 DIAGNOSIS — N870 Mild cervical dysplasia: Secondary | ICD-10-CM | POA: Diagnosis present

## 2022-12-27 DIAGNOSIS — R030 Elevated blood-pressure reading, without diagnosis of hypertension: Secondary | ICD-10-CM

## 2022-12-27 DIAGNOSIS — Z3041 Encounter for surveillance of contraceptive pills: Secondary | ICD-10-CM | POA: Diagnosis not present

## 2022-12-27 DIAGNOSIS — Z01419 Encounter for gynecological examination (general) (routine) without abnormal findings: Secondary | ICD-10-CM | POA: Insufficient documentation

## 2022-12-27 DIAGNOSIS — L700 Acne vulgaris: Secondary | ICD-10-CM

## 2022-12-27 MED ORDER — NORETHIN ACE-ETH ESTRAD-FE 1.5-30 MG-MCG PO TABS
1.0000 | ORAL_TABLET | Freq: Every day | ORAL | 4 refills | Status: DC
Start: 2022-12-27 — End: 2024-01-01

## 2022-12-27 NOTE — Progress Notes (Signed)
Jasmine Krause November 29, 1985 [id number]   History:  37 y.o. G1P1 presents for annual exam and repeat pap. 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 Would like STI screen with pap. Doing well on OCPs, would like to continue. Has a bump she noticed on her upper outer quadrant of her right breast, no pain or skin changes. PCP ordered mammogram.  Gynecologic History No LMP recorded. (Menstrual status: Oral contraceptives).   Contraception/Family planning: OCP (estrogen/progesterone) Sexually active: yes Last Pap: 2023. Results were: abnormal   Obstetric History OB History  Gravida Para Term Preterm AB Living  1 1 1  0 0 1  SAB IAB Ectopic Multiple Live Births  0 0 0 0 1    # Outcome Date GA Lbr Len/2nd Weight Sex Type Anes PTL Lv  1 Term 02/28/12 [redacted]w[redacted]d / 00:41 6 lb 14.1 oz (3.121 kg) F Vag-Spont EPI  LIV     The following portions of the patient's history were reviewed and updated as appropriate: allergies, current medications, past family history, past medical history, past social history, past surgical history, and problem list.  Review of Systems Pertinent items noted in HPI and remainder of comprehensive ROS otherwise negative.   Past medical history, past surgical history, family history and social history were all reviewed and documented in the EPIC chart.   Exam:  Vitals:   12/27/22 1022 12/27/22 1023  BP: (!) 162/78 (!) 162/80  Pulse:  78  Resp:  14  Weight:  (!) 314 lb (142.4 kg)  Height:  5' 8.25" (1.734 m)   Body mass index is 47.39 kg/m.  General appearance:  Normal, obese Thyroid:  Symmetrical, normal in size, without palpable masses or nodularity. Respiratory  Auscultation:  Clear without wheezing or rhonchi Cardiovascular  Auscultation:  Regular rate, without rubs, murmurs or gallops  Edema/varicosities:  Not grossly evident Abdominal  Soft,nontender, without masses, guarding or rebound.  Liver/spleen:  No organomegaly noted  Hernia:  None appreciated   Skin  Inspection:  Grossly normal Breasts: Examined lying and sitting. 8mm [place] present right breast upper outer quadrant.  Right: Without masses, retractions, nipple discharge or axillary adenopathy.   Left: Without masses, retractions, nipple discharge or axillary adenopathy. Genitourinary   Inguinal/mons:  Normal without inguinal adenopathy  External genitalia:  Normal appearing vulva with no masses, tenderness, or lesions  BUS/Urethra/Skene's glands:  Normal without masses or exudate  Vagina:  Normal appearing with normal color and discharge, no lesions  Cervix:  Normal appearing without discharge or lesions  Uterus:  Normal in size, shape and contour.  Mobile, nontender  Adnexa/parametria:     Rt: Normal in size, without masses or tenderness.   Lt: Normal in size, without masses or tenderness.  Anus and perineum: Normal   Raynelle Fanning, CMA present for exam  Assessment/Plan:   1. Encounter for routine gynecological examination with Papanicolaou smear of cervix ASCUS pap 2023 - Cytology - PAP( [place])  2. Mild cervical dysplasia, histologically confirmed - Cytology - PAP( [place])  3. Encounter for surveillance of contraceptive pills - norethindrone-ethinyl estradiol-iron (JUNEL FE 1.5/30) 1.5-30 MG-MCG tablet; Take 1 tablet by mouth daily.  Dispense: 84 tablet; Refill: 4  4. Screening for STDs (sexually transmitted diseases) - Cytology - PAP( [place])  5. Open comedone Warm compresses, discussed prevention of recurrence with panoxyl.   6. Elevated blood pressure reading Follow up with PCP, was normal last week at her visit with PCP   Discussed SBE, pap and STI  screening as directed/appropriate. Recommend of exercise weekly, including weight bearing exercise. Encouraged the use of seatbelts and sunscreen. Return in 1 year for annual or as needed.   Arlie Solomons B WHNP-BC 10:45 AM 12/27/2022

## 2022-12-30 LAB — CYTOLOGY - PAP
Chlamydia: NEGATIVE
Comment: NEGATIVE
Comment: NEGATIVE
Comment: NEGATIVE
Comment: NEGATIVE
Comment: NEGATIVE
Comment: NORMAL
Diagnosis: NEGATIVE
Diagnosis: REACTIVE
HPV 16: NEGATIVE
HPV 18 / 45: POSITIVE — AB
High risk HPV: POSITIVE — AB
Neisseria Gonorrhea: NEGATIVE
Trichomonas: NEGATIVE

## 2023-02-17 ENCOUNTER — Other Ambulatory Visit: Payer: Self-pay

## 2023-02-17 DIAGNOSIS — R87810 Cervical high risk human papillomavirus (HPV) DNA test positive: Secondary | ICD-10-CM

## 2023-02-20 ENCOUNTER — Other Ambulatory Visit: Payer: Self-pay | Admitting: Family

## 2023-02-20 DIAGNOSIS — N946 Dysmenorrhea, unspecified: Secondary | ICD-10-CM

## 2023-02-21 ENCOUNTER — Other Ambulatory Visit: Payer: Self-pay

## 2023-02-21 DIAGNOSIS — N946 Dysmenorrhea, unspecified: Secondary | ICD-10-CM

## 2023-02-21 MED ORDER — IBUPROFEN 800 MG PO TABS: 800.0000 mg | ORAL_TABLET | Freq: Three times a day (TID) | ORAL | 2 refills | Status: DC | PRN

## 2023-03-01 NOTE — Progress Notes (Signed)
Yes, thank you.

## 2023-03-06 ENCOUNTER — Encounter: Payer: Self-pay | Admitting: Radiology

## 2023-03-21 ENCOUNTER — Ambulatory Visit (INDEPENDENT_AMBULATORY_CARE_PROVIDER_SITE_OTHER): Payer: No Typology Code available for payment source | Admitting: Family

## 2023-03-21 ENCOUNTER — Other Ambulatory Visit: Payer: Self-pay | Admitting: Family

## 2023-03-21 VITALS — BP 142/87 | HR 101 | Temp 99.3°F | Resp 16 | Wt 314.0 lb

## 2023-03-21 DIAGNOSIS — E785 Hyperlipidemia, unspecified: Secondary | ICD-10-CM

## 2023-03-21 DIAGNOSIS — E119 Type 2 diabetes mellitus without complications: Secondary | ICD-10-CM

## 2023-03-21 DIAGNOSIS — E1165 Type 2 diabetes mellitus with hyperglycemia: Secondary | ICD-10-CM

## 2023-03-21 DIAGNOSIS — Z7984 Long term (current) use of oral hypoglycemic drugs: Secondary | ICD-10-CM

## 2023-03-21 DIAGNOSIS — Z23 Encounter for immunization: Secondary | ICD-10-CM

## 2023-03-21 DIAGNOSIS — M545 Low back pain, unspecified: Secondary | ICD-10-CM | POA: Diagnosis not present

## 2023-03-21 DIAGNOSIS — Z7985 Long-term (current) use of injectable non-insulin antidiabetic drugs: Secondary | ICD-10-CM | POA: Diagnosis not present

## 2023-03-21 DIAGNOSIS — R030 Elevated blood-pressure reading, without diagnosis of hypertension: Secondary | ICD-10-CM

## 2023-03-21 DIAGNOSIS — G8929 Other chronic pain: Secondary | ICD-10-CM | POA: Diagnosis not present

## 2023-03-21 DIAGNOSIS — Z794 Long term (current) use of insulin: Secondary | ICD-10-CM | POA: Diagnosis not present

## 2023-03-21 LAB — POCT GLYCOSYLATED HEMOGLOBIN (HGB A1C): Hemoglobin A1C: 7.1 % — AB (ref 4.0–5.6)

## 2023-03-21 MED ORDER — ATORVASTATIN CALCIUM 20 MG PO TABS: ORAL_TABLET | ORAL | 0 refills | Status: DC

## 2023-03-21 MED ORDER — KETOROLAC TROMETHAMINE 30 MG/ML IJ SOLN
30.0000 mg | Freq: Once | INTRAMUSCULAR | Status: AC
  Administered 2023-03-21: 30 mg via INTRAMUSCULAR

## 2023-03-21 MED ORDER — TRIAMCINOLONE ACETONIDE 40 MG/ML IJ SUSP
40.0000 mg | Freq: Once | INTRAMUSCULAR | Status: AC
  Administered 2023-03-21: 40 mg via INTRAMUSCULAR

## 2023-03-21 MED ORDER — LANTUS SOLOSTAR 100 UNIT/ML ~~LOC~~ SOPN: PEN_INJECTOR | SUBCUTANEOUS | 2 refills | Status: DC

## 2023-03-21 MED ORDER — METFORMIN HCL ER 500 MG PO TB24: 1000.0000 mg | ORAL_TABLET | Freq: Every day | ORAL | 0 refills | Status: DC

## 2023-03-21 MED ORDER — TRULICITY 1.5 MG/0.5ML ~~LOC~~ SOAJ: 1.5000 mg | SUBCUTANEOUS | 0 refills | Status: DC

## 2023-03-21 NOTE — Progress Notes (Signed)
Patient is here for their 3 month follow-up Patient has no concerns today Care gaps have been discussed with patient  Influenza given today

## 2023-03-21 NOTE — Progress Notes (Signed)
Patient ID: GWEN WOGOMAN, female    DOB: July 13, 1985  MRN: [id number]  CC: Chronic Conditions Follow-Up  Subjective: Jasmine Krause is a 37 y.o. female who presents for chronic conditions follow-up.   Her concerns today include:  - Doing well on Metformin XR, Insulin Glargine, and Dulagutide, no issues/concerns. Home blood sugars 160's. Denies red flag symptoms associated with diabetes.  - Doing well on Atorvastatin, no issues/concerns.  - Chronic low back pain. Denies recent trauma/injury and red flag symptoms. Ibuprofen providing minimal relief.   Patient Active Problem List   Diagnosis Date Noted   Cholecystitis 10/18/2021   Prediabetes 06/18/2020   Hyperlipidemia 06/18/2020   Morbid obesity (HCC) 05/15/2013     Current Outpatient Medications on File Prior to Visit  Medication Sig Dispense Refill   Accu-Chek Softclix Lancets lancets use as directed to check blood sugar 100 each 0   blood glucose meter kit and supplies Use up to four times daily as directed. 1 each 0   Continuous Blood Gluc Receiver (FREESTYLE LIBRE 2 READER) DEVI Use to check blood sugar TID. 1 each 0   Continuous Blood Gluc Sensor (FREESTYLE LIBRE 2 SENSOR) MISC Use to check blood sugar TID. 2 each 2   glucose blood test strip use to check blood sugars up to four times daily 100 each 0   ibuprofen (ADVIL) 800 MG tablet TAKE 1 TABLET(800 MG) BY MOUTH EVERY 8 HOURS AS NEEDED 30 tablet 2   ibuprofen (ADVIL) 800 MG tablet Take 1 tablet (800 mg total) by mouth every 8 (eight) hours as needed. 30 tablet 2   Insulin Pen Needle (PENTIPS) 32G X 4 MM MISC Use to inject insulin as directed. 100 each 0   norethindrone-ethinyl estradiol-iron (JUNEL FE 1.5/30) 1.5-30 MG-MCG tablet Take 1 tablet by mouth daily. 84 tablet 4   polyethylene glycol powder (GLYCOLAX/MIRALAX) 17 GM/SCOOP powder Take 17 g by mouth daily as needed for mild constipation or moderate constipation. 3350 g 1   No current facility-administered medications  on file prior to visit.    No Known Allergies  Social History   Socioeconomic History   Marital status: Single    Spouse name: n/a   Number of children: 0   Years of education: Associates   Highest education level: Associate degree: occupational, Scientist, product/process development, or vocational program  Occupational History   Occupation: TODDLER Magazine features editor: [employer]  Tobacco Use   Smoking status: Former    Passive exposure: Past   Smokeless tobacco: Never  Advertising account planner   Vaping status: Former  Substance and Sexual Activity   Alcohol use: Not Currently   Drug use: No   Sexual activity: Not Currently    Partners: Male    Birth control/protection: Pill    Comment: intercourse age 4, less than 5 sexual pafrtners, CURRENT PARTNER- 9 YRS  Other Topics Concern   Not on file  Social History Narrative   Lives with her boyfriend and their daughter.   Social Determinants of Health   Financial Resource Strain: Medium Risk (03/17/2023)   Overall Financial Resource Strain (CARDIA)    Difficulty of Paying Living Expenses: Somewhat hard  Food Insecurity: Food Insecurity Present (03/17/2023)   Hunger Vital Sign    Worried About Running Out of Food in the Last Year: Sometimes true    Ran Out of Food in the Last Year: Never true  Transportation Needs: No Transportation Needs (03/17/2023)   PRAPARE - Transportation    Lack  of Transportation (Medical): No    Lack of Transportation (Non-Medical): No  Physical Activity: Insufficiently Active (03/17/2023)   Exercise Vital Sign    Days of Exercise per Week: 1 day    Minutes of Exercise per Session: 10 min  Stress: Stress Concern Present (03/17/2023)   [employer] of Occupational Health - Occupational Stress Questionnaire    Feeling of Stress : Rather much  Social Connections: Socially Isolated (03/17/2023)   Social Connection and Isolation Panel [NHANES]    Frequency of Communication with Friends and Family: More than three times a week     Frequency of Social Gatherings with Friends and Family: Once a week    Attends Religious Services: Never    Database administrator or Organizations: No    Attends Engineer, structural: Not on file    Marital Status: Never married  Catering manager Violence: Not on file    Family History  Problem Relation Age of Onset   Diabetes Mother    Hypertension Mother    Diabetes Maternal Grandmother    Hypertension Maternal Grandmother    Other Neg Hx     Past Surgical History:  Procedure Laterality Date   CHOLECYSTECTOMY N/A 10/19/2021   Procedure: LAPAROSCOPIC CHOLECYSTECTOMY;  Surgeon: Fritzi Mandes, MD;  Location: [facility] OR;  Service: General;  Laterality: N/A;    ROS: Review of Systems Negative except as stated above  PHYSICAL EXAM: BP (!) 142/87   Pulse (!) 101   Temp 99.3 F (37.4 C) (Oral)   Resp 16   Wt (!) 314 lb (142.4 kg)   SpO2 97%   BMI 47.39 kg/m   Physical Exam HENT:     Head: Normocephalic and atraumatic.     Nose: Nose normal.     Mouth/Throat:     Mouth: Mucous membranes are moist.     Pharynx: Oropharynx is clear.  Eyes:     Extraocular Movements: Extraocular movements intact.     Conjunctiva/sclera: Conjunctivae normal.     Pupils: Pupils are equal, round, and reactive to light.  Cardiovascular:     Rate and Rhythm: Tachycardia present.     Pulses: Normal pulses.     Heart sounds: Normal heart sounds.  Pulmonary:     Effort: Pulmonary effort is normal.     Breath sounds: Normal breath sounds.  Musculoskeletal:        General: Normal range of motion.     Right shoulder: Normal.     Left shoulder: Normal.     Right upper arm: Normal.     Left upper arm: Normal.     Right elbow: Normal.     Left elbow: Normal.     Right forearm: Normal.     Left forearm: Normal.     Right wrist: Normal.     Left wrist: Normal.     Right hand: Normal.     Left hand: Normal.     Cervical back: Normal, normal range of motion and neck supple.      Thoracic back: Normal.     Lumbar back: Tenderness present.     Right hip: Normal.     Left hip: Normal.     Right upper leg: Normal.     Left upper leg: Normal.     Right knee: Normal.     Left knee: Normal.     Right lower leg: Normal.     Left lower leg: Normal.     Right ankle: Normal.  Left ankle: Normal.     Right foot: Normal.     Left foot: Normal.  Neurological:     General: No focal deficit present.     Mental Status: She is alert and oriented to person, place, and time.  Psychiatric:        Mood and Affect: Mood normal.        Behavior: Behavior normal.     ASSESSMENT AND PLAN: 1. Type 2 diabetes mellitus with hyperglycemia, with long-term current use of insulin (HCC) - Hemoglobin A1c relative to goal at 7.1%, goal 7%. - Continue Metformin XR, Insulin Glargine, and Dulaglutide as prescribed. Counseled on medication adherence/adverse effects.  - Routine screening.  - Discussed the importance of healthy eating habits, low-carbohydrate diet, low-sugar diet, regular aerobic exercise (at least 150 minutes a week as tolerated) and medication compliance to achieve or maintain control of diabetes. - Follow-up with primary provider in 3 months or sooner if needed.  - HgB A1c - Microalbumin / creatinine urine ratio - Dulaglutide (TRULICITY) 1.5 MG/0.5ML SOAJ; Inject 1.5 mg into the skin once a week.  Dispense: 6 mL; Refill: 0 - metFORMIN (GLUCOPHAGE-XR) 500 MG 24 hr tablet; Take 2 tablets (1,000 mg total) by mouth daily with breakfast.  Dispense: 180 tablet; Refill: 0 - insulin glargine (LANTUS SOLOSTAR) 100 UNIT/ML Solostar Pen; ADMINISTER 30 UNITS UNDER THE SKIN DAILY  Dispense: 15 mL; Refill: 2  2. Hyperlipidemia, unspecified hyperlipidemia type - Continue Atorvastatin as prescribed. Counseled on medication adherence/adverse effects.  - Follow-up with primary provider in 3 months or sooner if needed.  - atorvastatin (LIPITOR) 20 MG tablet; TAKE 1 TABLET(20 MG) BY MOUTH  DAILY  Dispense: 90 tablet; Refill: 0  3. Chronic low back pain, unspecified back pain laterality, unspecified whether sciatica present - Ketorolac injection and Triamcinolone Acetonide injection administered.  - Patient declined referral to Orthopedics.  - Follow-up with primary provider in 4 weeks or sooner if needed.  - ketorolac (TORADOL) 30 MG/ML injection 30 mg - triamcinolone acetonide (KENALOG-40) injection 40 mg  4. Encounter for immunization - Administered.  - Flu vaccine trivalent PF, 6mos and older(Flulaval,Afluria,Fluarix,Fluzone)  5. Elevated blood pressure reading - Blood pressure not at goal during today's visit. Patient asymptomatic without chest pressure, chest pain, palpitations, shortness of breath, worst headache of life, and any additional red flag symptoms. - Follow-up with primary provider in 2 weeks or sooner if needed for blood pressure check.   Patient was given the opportunity to ask questions.  Patient verbalized understanding of the plan and was able to repeat key elements of the plan. Patient was given clear instructions to go to Emergency Department or return to medical center if symptoms don't improve, worsen, or new problems develop.The patient verbalized understanding.   Orders Placed This Encounter  Procedures   Flu vaccine trivalent PF, 6mos and older(Flulaval,Afluria,Fluarix,Fluzone)   Microalbumin / creatinine urine ratio   HgB A1c     Requested Prescriptions   Signed Prescriptions Disp Refills   atorvastatin (LIPITOR) 20 MG tablet 90 tablet 0    Sig: TAKE 1 TABLET(20 MG) BY MOUTH DAILY   Dulaglutide (TRULICITY) 1.5 MG/0.5ML SOAJ 6 mL 0    Sig: Inject 1.5 mg into the skin once a week.   metFORMIN (GLUCOPHAGE-XR) 500 MG 24 hr tablet 180 tablet 0    Sig: Take 2 tablets (1,000 mg total) by mouth daily with breakfast.   insulin glargine (LANTUS SOLOSTAR) 100 UNIT/ML Solostar Pen 15 mL 2    Sig:  ADMINISTER 30 UNITS UNDER THE SKIN DAILY     Return in about 3 months (around 06/19/2023) for Follow-Up or next available chronic conditions and 2 weeks blood pressure check.  Rema Fendt, NP

## 2023-03-23 LAB — MICROALBUMIN / CREATININE URINE RATIO
Creatinine, Urine: 337.6 mg/dL
Microalb/Creat Ratio: 184 mg/g{creat} — ABNORMAL HIGH (ref 0–29)
Microalbumin, Urine: 620.2 ug/mL

## 2023-03-24 ENCOUNTER — Other Ambulatory Visit: Payer: Self-pay | Admitting: Family

## 2023-03-24 DIAGNOSIS — R809 Proteinuria, unspecified: Secondary | ICD-10-CM

## 2023-03-24 MED ORDER — LISINOPRIL 5 MG PO TABS: 5.0000 mg | ORAL_TABLET | Freq: Every day | ORAL | 1 refills | Status: DC

## 2023-03-27 ENCOUNTER — Telehealth: Payer: Self-pay | Admitting: Obstetrics and Gynecology

## 2023-03-27 NOTE — Telephone Encounter (Signed)
Dr Karma Greaser ordered colposcopy to be scheduled. Left message on November 1 & 11 and mailed letter on November 18 for patient to call and schedule appointment. Patient has not scheduled.

## 2023-04-03 NOTE — Telephone Encounter (Signed)
Also see MyChart encounter dated 01/19/23.   Left message to call Noreene Larsson, RN at [facility], [phone], option 5.

## 2023-04-07 ENCOUNTER — Ambulatory Visit: Payer: Self-pay | Admitting: Family

## 2023-04-18 NOTE — Telephone Encounter (Signed)
MyChart message to patient.  

## 2023-05-03 NOTE — Telephone Encounter (Signed)
 Spoke with patient. Patient request to proceed with colposcopy.   12/27/22 PAP Neg, positive HR HPV 18/45.   OCP for contraceptive, does not typically have menses with OCP, occasional spotting.   Colpo reviewed, patient familiar with procedure. Scheduled for 05/31/23 at 0830 with Dr. Tia Flowers, arriving at 0815. Instructed to take Motrin  800 mg with food and water one hour before procedure.  Patient declined earlier dates due to work schedule.   Routing to provider for final review. Patient is agreeable to disposition. Will close encounter.

## 2023-05-19 ENCOUNTER — Other Ambulatory Visit: Payer: Self-pay

## 2023-05-19 ENCOUNTER — Other Ambulatory Visit: Payer: Self-pay | Admitting: Family

## 2023-05-19 DIAGNOSIS — K5900 Constipation, unspecified: Secondary | ICD-10-CM

## 2023-05-19 MED ORDER — POLYETHYLENE GLYCOL 3350 17 GM/SCOOP PO POWD: 17.0000 g | Freq: Every day | ORAL | 1 refills | Status: DC | PRN

## 2023-05-31 ENCOUNTER — Encounter: Payer: Self-pay | Admitting: Obstetrics and Gynecology

## 2023-05-31 ENCOUNTER — Other Ambulatory Visit (HOSPITAL_COMMUNITY)
Admission: RE | Admit: 2023-05-31 | Discharge: 2023-05-31 | Disposition: A | Discharge status: Home / Self Care | Payer: No Typology Code available for payment source | Source: Ambulatory Visit | Attending: Obstetrics and Gynecology | Admitting: Obstetrics and Gynecology

## 2023-05-31 ENCOUNTER — Ambulatory Visit: Payer: No Typology Code available for payment source | Admitting: Obstetrics and Gynecology

## 2023-05-31 VITALS — BP 142/92 | HR 106

## 2023-05-31 DIAGNOSIS — Z01419 Encounter for gynecological examination (general) (routine) without abnormal findings: Secondary | ICD-10-CM | POA: Insufficient documentation

## 2023-05-31 DIAGNOSIS — Z01812 Encounter for preprocedural laboratory examination: Secondary | ICD-10-CM

## 2023-05-31 DIAGNOSIS — Z1151 Encounter for screening for human papillomavirus (HPV): Secondary | ICD-10-CM | POA: Diagnosis not present

## 2023-05-31 DIAGNOSIS — L0292 Furuncle, unspecified: Secondary | ICD-10-CM | POA: Diagnosis not present

## 2023-05-31 DIAGNOSIS — Z124 Encounter for screening for malignant neoplasm of cervix: Secondary | ICD-10-CM

## 2023-05-31 DIAGNOSIS — Z8619 Personal history of other infectious and parasitic diseases: Secondary | ICD-10-CM | POA: Diagnosis not present

## 2023-05-31 DIAGNOSIS — B977 Papillomavirus as the cause of diseases classified elsewhere: Secondary | ICD-10-CM | POA: Diagnosis not present

## 2023-05-31 DIAGNOSIS — R87810 Cervical high risk human papillomavirus (HPV) DNA test positive: Secondary | ICD-10-CM | POA: Diagnosis not present

## 2023-05-31 LAB — PREGNANCY, URINE: Preg Test, Ur: NEGATIVE

## 2023-05-31 NOTE — Progress Notes (Signed)
Patient presents for colposcopy for normal pap smear with genotype positive 16/18 in September She has not shown for her colposcopy until this time. Has not completed gardesil vaccination, but is interested. Discussed running repeat pap smear first since close to 6 months and she agreed. Has h/o right breast boil that drained a while back and now feels like a recurrent cyst again.  Blood pressure (!) 142/92, pulse (!) 106, SpO2 98%.  Right breast with 1cm boil 11 o'clock no erythema. Has not come to a head  SVE: no lesions, normal discharge  A/p genotype pos 16/18  Pap smear collected.  To notify patient of results. Patient to verify with her insurance if she has coverage for the gardesil.  Discussed she can schedule here, if she desires and counseled on the vaccination.     Dr. Karma Greaser

## 2023-06-05 ENCOUNTER — Other Ambulatory Visit: Payer: Self-pay | Admitting: Family

## 2023-06-05 DIAGNOSIS — N946 Dysmenorrhea, unspecified: Secondary | ICD-10-CM

## 2023-06-06 ENCOUNTER — Encounter: Payer: Self-pay | Admitting: Obstetrics and Gynecology

## 2023-06-06 LAB — CYTOLOGY - PAP
Comment: NEGATIVE
Diagnosis: NEGATIVE
High risk HPV: NEGATIVE

## 2023-06-07 NOTE — Telephone Encounter (Signed)
 Complete

## 2023-06-19 ENCOUNTER — Ambulatory Visit (INDEPENDENT_AMBULATORY_CARE_PROVIDER_SITE_OTHER): Payer: No Typology Code available for payment source | Admitting: Family

## 2023-06-19 ENCOUNTER — Encounter: Payer: Self-pay | Admitting: Family

## 2023-06-19 VITALS — BP 144/83 | HR 92 | Temp 98.7°F | Ht 68.5 in | Wt 317.2 lb

## 2023-06-19 DIAGNOSIS — E1165 Type 2 diabetes mellitus with hyperglycemia: Secondary | ICD-10-CM | POA: Diagnosis not present

## 2023-06-19 DIAGNOSIS — E785 Hyperlipidemia, unspecified: Secondary | ICD-10-CM

## 2023-06-19 DIAGNOSIS — R030 Elevated blood-pressure reading, without diagnosis of hypertension: Secondary | ICD-10-CM

## 2023-06-19 DIAGNOSIS — Z794 Long term (current) use of insulin: Secondary | ICD-10-CM | POA: Diagnosis not present

## 2023-06-19 LAB — POCT GLYCOSYLATED HEMOGLOBIN (HGB A1C): HbA1c, POC (controlled diabetic range): 6.8 % (ref 0.0–7.0)

## 2023-06-19 MED ORDER — LANTUS SOLOSTAR 100 UNIT/ML ~~LOC~~ SOPN: PEN_INJECTOR | SUBCUTANEOUS | 2 refills | Status: DC

## 2023-06-19 MED ORDER — TRULICITY 1.5 MG/0.5ML ~~LOC~~ SOAJ: 1.5000 mg | SUBCUTANEOUS | 0 refills | Status: AC

## 2023-06-19 MED ORDER — ATORVASTATIN CALCIUM 20 MG PO TABS: ORAL_TABLET | ORAL | 0 refills | Status: DC

## 2023-06-19 MED ORDER — GLUCOSE BLOOD VI STRP
ORAL_STRIP | 0 refills | Status: AC
Start: 2023-06-19 — End: ?

## 2023-06-19 MED ORDER — METFORMIN HCL ER 500 MG PO TB24: 1000.0000 mg | ORAL_TABLET | Freq: Every day | ORAL | 0 refills | Status: DC

## 2023-06-19 NOTE — Progress Notes (Signed)
 Patient states lisinopril she had an allergic reaction too   Patient needs test strip.

## 2023-06-19 NOTE — Progress Notes (Signed)
 Patient ID: Jasmine Krause, female    DOB: [birth year]  MRN: [id number]  CC: Chronic Conditions Follow-Up  Subjective: Jasmine Krause is a 38 y.o. female who presents for chronic conditions follow-up.   Her concerns today include:  - Doing well on Metformin XR, Insulin Glargine, and Dulaglutide, no issues/concerns. Needs glucose test strips refilled. Denies red flag symptoms associated with diabetes.  - Doing well on Atorvastatin, no issues/concerns.  - She does not check blood pressure outside of office. She does not complain of red flag symptoms such as but not limited to chest pain, shortness of breath, worst headache of life, nausea/vomiting.  - Reports allergic reaction to Lisinopril caused lip swelling with no red flag symptoms.  - States plans to return Orthopedics call soon to schedule appointment.   Patient Active Problem List   Diagnosis Date Noted   Cholecystitis 10/18/2021   Prediabetes 06/18/2020   Hyperlipidemia 06/18/2020   Morbid obesity (HCC) 05/15/2013     Current Outpatient Medications on File Prior to Visit  Medication Sig Dispense Refill   Accu-Chek Softclix Lancets lancets use as directed to check blood sugar 100 each 0   blood glucose meter kit and supplies Use up to four times daily as directed. 1 each 0   Continuous Blood Gluc Receiver (FREESTYLE LIBRE 2 READER) DEVI Use to check blood sugar TID. 1 each 0   Continuous Blood Gluc Sensor (FREESTYLE LIBRE 2 SENSOR) MISC Use to check blood sugar TID. 2 each 2   ibuprofen (ADVIL) 800 MG tablet TAKE 1 TABLET(800 MG) BY MOUTH EVERY 8 HOURS AS NEEDED 90 tablet 0   Insulin Pen Needle (PENTIPS) 32G X 4 MM MISC Use to inject insulin as directed. 100 each 0   norethindrone-ethinyl estradiol-iron (JUNEL FE 1.5/30) 1.5-30 MG-MCG tablet Take 1 tablet by mouth daily. 84 tablet 4   polyethylene glycol powder (GLYCOLAX/MIRALAX) 17 GM/SCOOP powder TAKE 17G BY MOUTH AS NEEDED FOR MILD CONSTIPATION OR MODERATE CONSTIPATION 17 g 0    No current facility-administered medications on file prior to visit.    Allergies  Allergen Reactions   Lisinopril Swelling    lips    Social History   Socioeconomic History   Marital status: Single    Spouse name: n/a   Number of children: 0   Years of education: Associates   Highest education level: Associate degree: academic program  Occupational History   Occupation: TODDLER Magazine features editor: [employer]  Tobacco Use   Smoking status: Former    Passive exposure: Past   Smokeless tobacco: Never  Advertising account planner   Vaping status: Former  Substance and Sexual Activity   Alcohol use: Not Currently   Drug use: No   Sexual activity: Not Currently    Partners: Male    Birth control/protection: Pill    Comment: intercourse age 35, less than 5 sexual pafrtners, CURRENT PARTNER- 9 YRS  Other Topics Concern   Not on file  Social History Narrative   Lives with her boyfriend and their daughter.   Social Drivers of Health   Financial Resource Strain: High Risk (06/18/2023)   Overall Financial Resource Strain (CARDIA)    Difficulty of Paying Living Expenses: Very hard  Food Insecurity: Food Insecurity Present (06/18/2023)   Hunger Vital Sign    Worried About Running Out of Food in the Last Year: Sometimes true    Ran Out of Food in the Last Year: Often true  Transportation Needs: No Transportation Needs (06/18/2023)  PRAPARE - Administrator, Civil Service (Medical): No    Lack of Transportation (Non-Medical): No  Physical Activity: Insufficiently Active (06/18/2023)   Exercise Vital Sign    Days of Exercise per Week: 1 day    Minutes of Exercise per Session: 10 min  Stress: Stress Concern Present (06/18/2023)   [employer] of Occupational Health - Occupational Stress Questionnaire    Feeling of Stress : To some extent  Social Connections: Moderately Isolated (06/18/2023)   Social Connection and Isolation Panel [NHANES]    Frequency of Communication with  Friends and Family: Twice a week    Frequency of Social Gatherings with Friends and Family: Once a week    Attends Religious Services: 1 to 4 times per year    Active Member of [employer] or Organizations: No    Attends Engineer, structural: Not on file    Marital Status: Never married  Catering manager Violence: Not on file    Family History  Problem Relation Age of Onset   Diabetes Mother    Hypertension Mother    Diabetes Maternal Grandmother    Hypertension Maternal Grandmother    Other Neg Hx     Past Surgical History:  Procedure Laterality Date   CHOLECYSTECTOMY N/A 10/19/2021   Procedure: LAPAROSCOPIC CHOLECYSTECTOMY;  Surgeon: Fritzi Mandes, MD;  Location: [facility] OR;  Service: General;  Laterality: N/A;    ROS: Review of Systems Negative except as stated above  PHYSICAL EXAM: BP (!) 144/83   Pulse 92   Temp 98.7 F (37.1 C) (Oral)   Ht 5' 8.5" (1.74 m)   Wt (!) 317 lb 3.2 oz (143.9 kg)   SpO2 96%   BMI 47.53 kg/m   Physical Exam HENT:     Head: Normocephalic and atraumatic.     Nose: Nose normal.     Mouth/Throat:     Mouth: Mucous membranes are moist.     Pharynx: Oropharynx is clear.  Eyes:     Extraocular Movements: Extraocular movements intact.     Conjunctiva/sclera: Conjunctivae normal.     Pupils: Pupils are equal, round, and reactive to light.  Cardiovascular:     Rate and Rhythm: Normal rate and regular rhythm.     Pulses: Normal pulses.     Heart sounds: Normal heart sounds.  Pulmonary:     Effort: Pulmonary effort is normal.     Breath sounds: Normal breath sounds.  Musculoskeletal:        General: Normal range of motion.     Cervical back: Normal range of motion and neck supple.  Neurological:     General: No focal deficit present.     Mental Status: She is alert and oriented to person, place, and time.  Psychiatric:        Mood and Affect: Mood normal.        Behavior: Behavior normal.     ASSESSMENT AND PLAN: 1. Type 2  diabetes mellitus with hyperglycemia, with long-term current use of insulin (HCC) (Primary) - Hemoglobin A1c at goal at 6.8%, goal 7%. - Continue Metformin XR, Insulin Glargine, and Dulaglutide as prescribed. Counseled on medication adherence/adverse effects. - Glucose blood test strips refilled per patient request.  - Discussed the importance of healthy eating habits, low-carbohydrate diet, low-sugar diet, regular aerobic exercise (at least 150 minutes a week as tolerated) and medication compliance to achieve or maintain control of diabetes. - Follow-up with primary provider in 3 months or sooner if needed.  -  POCT glycosylated hemoglobin (Hb A1C) - insulin glargine (LANTUS SOLOSTAR) 100 UNIT/ML Solostar Pen; ADMINISTER 30 UNITS UNDER THE SKIN DAILY  Dispense: 15 mL; Refill: 2 - metFORMIN (GLUCOPHAGE-XR) 500 MG 24 hr tablet; Take 2 tablets (1,000 mg total) by mouth daily with breakfast.  Dispense: 180 tablet; Refill: 0 - Dulaglutide (TRULICITY) 1.5 MG/0.5ML SOAJ; Inject 1.5 mg into the skin once a week.  Dispense: 6 mL; Refill: 0 - glucose blood test strip; use to check blood sugars up to four times daily  Dispense: 100 each; Refill: 0  2. Hyperlipidemia, unspecified hyperlipidemia type - Continue Atorvastatin as prescribed. Counseled on medication adherence/adverse effects.  - Follow-up with primary provider in 3 months or sooner if needed.  - atorvastatin (LIPITOR) 20 MG tablet; TAKE 1 TABLET(20 MG) BY MOUTH DAILY  Dispense: 90 tablet; Refill: 0  3. Elevated blood pressure reading - The patient does not complain of red flag symptoms such as but not limited to chest pain, shortness of breath, worst headache of life, nausea/vomiting.  - Follow-up with primary provider in 4 weeks or sooner if needed.    Patient was given the opportunity to ask questions.  Patient verbalized understanding of the plan and was able to repeat key elements of the plan. Patient was given clear instructions to go to  Emergency Department or return to medical center if symptoms don't improve, worsen, or new problems develop.The patient verbalized understanding.   Orders Placed This Encounter  Procedures   POCT glycosylated hemoglobin (Hb A1C)     Requested Prescriptions   Signed Prescriptions Disp Refills   insulin glargine (LANTUS SOLOSTAR) 100 UNIT/ML Solostar Pen 15 mL 2    Sig: ADMINISTER 30 UNITS UNDER THE SKIN DAILY   metFORMIN (GLUCOPHAGE-XR) 500 MG 24 hr tablet 180 tablet 0    Sig: Take 2 tablets (1,000 mg total) by mouth daily with breakfast.   Dulaglutide (TRULICITY) 1.5 MG/0.5ML SOAJ 6 mL 0    Sig: Inject 1.5 mg into the skin once a week.   atorvastatin (LIPITOR) 20 MG tablet 90 tablet 0    Sig: TAKE 1 TABLET(20 MG) BY MOUTH DAILY   glucose blood test strip 100 each 0    Sig: use to check blood sugars up to four times daily    Return in about 3 months (around 09/19/2023) for Follow-Up or next available chronic conditions and 4 weeks blood pressure check.  Rema Fendt, NP

## 2023-07-05 ENCOUNTER — Other Ambulatory Visit: Payer: Self-pay | Admitting: Obstetrics and Gynecology

## 2023-07-05 DIAGNOSIS — L0292 Furuncle, unspecified: Secondary | ICD-10-CM

## 2023-07-18 ENCOUNTER — Ambulatory Visit: Admitting: Family

## 2023-08-01 ENCOUNTER — Encounter

## 2023-08-01 ENCOUNTER — Other Ambulatory Visit

## 2023-08-01 ENCOUNTER — Ambulatory Visit
Admission: RE | Admit: 2023-08-01 | Discharge: 2023-08-01 | Disposition: A | Discharge status: Home / Self Care | Source: Ambulatory Visit | Attending: Obstetrics and Gynecology

## 2023-08-01 ENCOUNTER — Ambulatory Visit
Admission: RE | Admit: 2023-08-01 | Discharge: 2023-08-01 | Disposition: A | Discharge status: Home / Self Care | Source: Ambulatory Visit | Attending: Obstetrics and Gynecology | Admitting: Obstetrics and Gynecology

## 2023-08-01 DIAGNOSIS — L0292 Furuncle, unspecified: Secondary | ICD-10-CM

## 2023-09-19 ENCOUNTER — Encounter: Payer: Self-pay | Admitting: Family

## 2023-09-19 ENCOUNTER — Ambulatory Visit (INDEPENDENT_AMBULATORY_CARE_PROVIDER_SITE_OTHER): Admitting: Family

## 2023-09-19 VITALS — BP 142/88 | HR 95 | Temp 98.8°F | Resp 16 | Ht 68.5 in | Wt 317.0 lb

## 2023-09-19 DIAGNOSIS — F419 Anxiety disorder, unspecified: Secondary | ICD-10-CM

## 2023-09-19 DIAGNOSIS — Z7984 Long term (current) use of oral hypoglycemic drugs: Secondary | ICD-10-CM | POA: Diagnosis not present

## 2023-09-19 DIAGNOSIS — Z23 Encounter for immunization: Secondary | ICD-10-CM | POA: Diagnosis not present

## 2023-09-19 DIAGNOSIS — I10 Essential (primary) hypertension: Secondary | ICD-10-CM | POA: Diagnosis not present

## 2023-09-19 DIAGNOSIS — M545 Low back pain, unspecified: Secondary | ICD-10-CM

## 2023-09-19 DIAGNOSIS — E785 Hyperlipidemia, unspecified: Secondary | ICD-10-CM

## 2023-09-19 DIAGNOSIS — Z794 Long term (current) use of insulin: Secondary | ICD-10-CM

## 2023-09-19 DIAGNOSIS — E1165 Type 2 diabetes mellitus with hyperglycemia: Secondary | ICD-10-CM | POA: Diagnosis not present

## 2023-09-19 DIAGNOSIS — F418 Other specified anxiety disorders: Secondary | ICD-10-CM

## 2023-09-19 DIAGNOSIS — G8929 Other chronic pain: Secondary | ICD-10-CM

## 2023-09-19 MED ORDER — AMLODIPINE BESYLATE 5 MG PO TABS
5.0000 mg | ORAL_TABLET | Freq: Every day | ORAL | 0 refills | Status: DC
Start: 2023-09-19 — End: 2023-10-24

## 2023-09-19 MED ORDER — METFORMIN HCL ER 500 MG PO TB24: 1000.0000 mg | ORAL_TABLET | Freq: Every day | ORAL | 0 refills | Status: DC

## 2023-09-19 MED ORDER — LANTUS SOLOSTAR 100 UNIT/ML ~~LOC~~ SOPN: PEN_INJECTOR | SUBCUTANEOUS | 2 refills | Status: DC

## 2023-09-19 MED ORDER — ATORVASTATIN CALCIUM 20 MG PO TABS: ORAL_TABLET | ORAL | 0 refills | Status: DC

## 2023-09-19 NOTE — Progress Notes (Signed)
 Patient ID: Jasmine Krause, female    DOB: 01-27-1986  MRN: [id number]  CC: Chronic Conditions Follow-Up  Subjective: Jasmine Krause is a 38 y.o. female who presents for chronic conditions follow-up.   Her concerns today include:  - Doing well on Metformin  XR and Insulin  Glargine, no issues/concerns. States she has not taken Dulaglutide  in 1 month due to out of pocket cost $30. She denies red flag symptoms associated with diabetes.  - Doing well on Atorvastatin , no issues/concerns. - Anxiety depression related to work-life balance. She would like to referral to therapist. She denies thoughts of self-harm, suicidal ideations, homicidal ideations. - Chronic low back pain persisting. Denies recent trauma/injury and red flag symptoms. She would like referral to a specialist.   Patient Active Problem List   Diagnosis Date Noted   Cholecystitis 10/18/2021   Prediabetes 06/18/2020   Hyperlipidemia 06/18/2020   Morbid obesity (HCC) 05/15/2013     Current Outpatient Medications on File Prior to Visit  Medication Sig Dispense Refill   Accu-Chek Softclix Lancets lancets use as directed to check blood sugar 100 each 0   blood glucose meter kit and supplies Use up to four times daily as directed. 1 each 0   Continuous Blood Gluc Receiver (FREESTYLE LIBRE 2 READER) DEVI Use to check blood sugar TID. 1 each 0   Continuous Blood Gluc Sensor (FREESTYLE LIBRE 2 SENSOR) MISC Use to check blood sugar TID. 2 each 2   Dulaglutide  (TRULICITY ) 1.5 MG/0.5ML SOAJ Inject 1.5 mg into the skin once a week. 6 mL 0   glucose blood test strip use to check blood sugars up to four times daily 100 each 0   ibuprofen  (ADVIL ) 800 MG tablet TAKE 1 TABLET(800 MG) BY MOUTH EVERY 8 HOURS AS NEEDED 90 tablet 0   Insulin  Pen Needle (PENTIPS) 32G X 4 MM MISC Use to inject insulin  as directed. 100 each 0   norethindrone-ethinyl estradiol-iron (JUNEL FE 1.5/30) 1.5-30 MG-MCG tablet Take 1 tablet by mouth daily. 84 tablet 4    polyethylene glycol powder (GLYCOLAX /MIRALAX ) 17 GM/SCOOP powder TAKE 17G BY MOUTH AS NEEDED FOR MILD CONSTIPATION OR MODERATE CONSTIPATION 17 g 0   No current facility-administered medications on file prior to visit.    Allergies  Allergen Reactions   Lisinopril  Swelling    lips    Social History   Socioeconomic History   Marital status: Single    Spouse name: n/a   Number of children: 0   Years of education: Associates   Highest education level: Associate degree: academic program  Occupational History   Occupation: TODDLER Magazine features editor: [employer]  Tobacco Use   Smoking status: Former    Passive exposure: Past   Smokeless tobacco: Never  Advertising account planner   Vaping status: Former  Substance and Sexual Activity   Alcohol use: Not Currently   Drug use: No   Sexual activity: Not Currently    Partners: Male    Birth control/protection: Pill    Comment: intercourse age 69, less than 5 sexual pafrtners, CURRENT PARTNER- 9 YRS  Other Topics Concern   Not on file  Social History Narrative   Lives with her boyfriend and their daughter.   Social Drivers of Health   Financial Resource Strain: High Risk (06/18/2023)   Overall Financial Resource Strain (CARDIA)    Difficulty of Paying Living Expenses: Very hard  Food Insecurity: Food Insecurity Present (06/18/2023)   Hunger Vital Sign    Worried About  Running Out of Food in the Last Year: Sometimes true    Ran Out of Food in the Last Year: Often true  Transportation Needs: No Transportation Needs (06/18/2023)   PRAPARE - Administrator, Civil Service (Medical): No    Lack of Transportation (Non-Medical): No  Physical Activity: Insufficiently Active (06/18/2023)   Exercise Vital Sign    Days of Exercise per Week: 1 day    Minutes of Exercise per Session: 10 min  Stress: Stress Concern Present (06/18/2023)   [employer] of Occupational Health - Occupational Stress Questionnaire    Feeling of Stress : To some  extent  Social Connections: Moderately Isolated (06/18/2023)   Social Connection and Isolation Panel [NHANES]    Frequency of Communication with Friends and Family: Twice a week    Frequency of Social Gatherings with Friends and Family: Once a week    Attends Religious Services: 1 to 4 times per year    Active Member of [employer] or Organizations: No    Attends Banker Meetings: Not on file    Marital Status: Never married  Intimate Partner Violence: Not At Risk (09/19/2023)   Humiliation, Afraid, Rape, and Kick questionnaire    Fear of Current or Ex-Partner: No    Emotionally Abused: No    Physically Abused: No    Sexually Abused: No    Family History  Problem Relation Age of Onset   Diabetes Mother    Hypertension Mother    Diabetes Maternal Grandmother    Hypertension Maternal Grandmother    Other Neg Hx     Past Surgical History:  Procedure Laterality Date   CHOLECYSTECTOMY N/A 10/19/2021   Procedure: LAPAROSCOPIC CHOLECYSTECTOMY;  Surgeon: Lujean Sake, MD;  Location: [facility] OR;  Service: General;  Laterality: N/A;    ROS: Review of Systems Negative except as stated above  PHYSICAL EXAM: BP (!) 142/88   Pulse 95   Temp 98.8 F (37.1 C) (Oral)   Resp 16   Ht 5' 8.5" (1.74 m)   Wt (!) 317 lb (143.8 kg)   LMP  (LMP Unknown)   SpO2 97%   BMI 47.50 kg/m   Physical Exam HENT:     Head: Normocephalic and atraumatic.     Nose: Nose normal.     Mouth/Throat:     Mouth: Mucous membranes are moist.     Pharynx: Oropharynx is clear.  Eyes:     Extraocular Movements: Extraocular movements intact.     Conjunctiva/sclera: Conjunctivae normal.     Pupils: Pupils are equal, round, and reactive to light.  Cardiovascular:     Rate and Rhythm: Normal rate and regular rhythm.     Pulses: Normal pulses.     Heart sounds: Normal heart sounds.  Pulmonary:     Effort: Pulmonary effort is normal.     Breath sounds: Normal breath sounds.  Musculoskeletal:         General: Normal range of motion.     Cervical back: Normal range of motion and neck supple.  Neurological:     General: No focal deficit present.     Mental Status: She is alert and oriented to person, place, and time.  Psychiatric:        Mood and Affect: Mood normal.        Behavior: Behavior normal.     ASSESSMENT AND PLAN: 1. Primary hypertension (Primary) - Blood pressure not at goal during today's visit. Patient asymptomatic without chest pressure,  chest pain, palpitations, shortness of breath, worst headache of life, and any additional red flag symptoms. - Amlodipine as prescribed.  - Patient intolerant to Lisinopril . - Routine screening.  - Counseled on blood pressure goal of less than 130/80, low-sodium, DASH diet, medication compliance, and 150 minutes of moderate intensity exercise per week as tolerated. Counseled on medication adherence and adverse effects. - Follow-up with primary provider in 4 weeks or sooner if needed. - amLODipine (NORVASC) 5 MG tablet; Take 1 tablet (5 mg total) by mouth daily.  Dispense: 90 tablet; Refill: 0 - Basic Metabolic Panel  2. Type 2 diabetes mellitus with hyperglycemia, with long-term current use of insulin  (HCC) - Continue Metformin  XR and Insulin  Glargine as prescribed.  - Hemoglobin A1c result pending. - Discussed the importance of healthy eating habits, low-carbohydrate diet, low-sugar diet, regular aerobic exercise (at least 150 minutes a week as tolerated) and medication compliance to achieve or maintain control of diabetes. Counseled on medication adherence/adverse effects.  - Follow-up with primary provider as scheduled. - metFORMIN  (GLUCOPHAGE -XR) 500 MG 24 hr tablet; Take 2 tablets (1,000 mg total) by mouth daily with breakfast.  Dispense: 180 tablet; Refill: 0 - insulin  glargine (LANTUS  SOLOSTAR) 100 UNIT/ML Solostar Pen; ADMINISTER 30 UNITS UNDER THE SKIN DAILY  Dispense: 15 mL; Refill: 2 - Hemoglobin A1c  3. Hyperlipidemia,  unspecified hyperlipidemia type - Continue Atorvastatin  as prescribed. Counseled on medication adherence/adverse effects.  - Follow-up with primary provider in 3 months or sooner if needed. - atorvastatin  (LIPITOR) 20 MG tablet; TAKE 1 TABLET(20 MG) BY MOUTH DAILY  Dispense: 90 tablet; Refill: 0  4. Chronic low back pain, unspecified back pain laterality, unspecified whether sciatica present - Referral to Orthopedic Surgery for evaluation/management. - Ambulatory referral to Orthopedic Surgery  5. Anxiety and depression - Patient denies thoughts of self-harm, suicidal ideations, homicidal ideations. - Patient declined pharmacological therapy.  - Referral to Psychiatry for evaluation/management.  - Follow-up with primary provider as scheduled. - Ambulatory referral to Psychiatry  6. Immunization due - Administered. - Tdap vaccine greater than or equal to 7yo IM   Patient was given the opportunity to ask questions.  Patient verbalized understanding of the plan and was able to repeat key elements of the plan. Patient was given clear instructions to go to Emergency Department or return to medical center if symptoms don't improve, worsen, or new problems develop.The patient verbalized understanding.   Orders Placed This Encounter  Procedures   Tdap vaccine greater than or equal to 7yo IM   Hemoglobin A1c   Basic Metabolic Panel   Ambulatory referral to Psychiatry   Ambulatory referral to Orthopedic Surgery     Requested Prescriptions   Signed Prescriptions Disp Refills   metFORMIN  (GLUCOPHAGE -XR) 500 MG 24 hr tablet 180 tablet 0    Sig: Take 2 tablets (1,000 mg total) by mouth daily with breakfast.   insulin  glargine (LANTUS  SOLOSTAR) 100 UNIT/ML Solostar Pen 15 mL 2    Sig: ADMINISTER 30 UNITS UNDER THE SKIN DAILY   atorvastatin  (LIPITOR) 20 MG tablet 90 tablet 0    Sig: TAKE 1 TABLET(20 MG) BY MOUTH DAILY   amLODipine (NORVASC) 5 MG tablet 90 tablet 0    Sig: Take 1 tablet (5  mg total) by mouth daily.    Return in about 4 weeks (around 10/17/2023) for Follow-Up or next available chronic conditions.  Jasmine Dama, NP

## 2023-09-19 NOTE — Progress Notes (Signed)
 No concerns, patient scored a 13 on PHQ-9and a 11 on her GAD7

## 2023-09-20 ENCOUNTER — Ambulatory Visit: Payer: Self-pay | Admitting: Family

## 2023-09-20 DIAGNOSIS — E1165 Type 2 diabetes mellitus with hyperglycemia: Secondary | ICD-10-CM

## 2023-09-20 LAB — BASIC METABOLIC PANEL WITH GFR
BUN/Creatinine Ratio: 17 (ref 9–23)
BUN: 10 mg/dL (ref 6–20)
CO2: 17 mmol/L — ABNORMAL LOW (ref 20–29)
Calcium: 9.3 mg/dL (ref 8.7–10.2)
Chloride: 103 mmol/L (ref 96–106)
Creatinine, Ser: 0.6 mg/dL (ref 0.57–1.00)
Glucose: 163 mg/dL — ABNORMAL HIGH (ref 70–99)
Potassium: 5.1 mmol/L (ref 3.5–5.2)
Sodium: 138 mmol/L (ref 134–144)
eGFR: 120 mL/min/{1.73_m2} (ref >59)

## 2023-09-20 LAB — HEMOGLOBIN A1C
Est. average glucose Bld gHb Est-mCnc: 171 mg/dL
Hgb A1c MFr Bld: 7.6 % — ABNORMAL HIGH (ref 4.8–5.6)

## 2023-09-20 MED ORDER — LANTUS SOLOSTAR 100 UNIT/ML ~~LOC~~ SOPN: PEN_INJECTOR | SUBCUTANEOUS | 2 refills | Status: DC

## 2023-10-16 ENCOUNTER — Encounter: Payer: Self-pay | Admitting: Family

## 2023-10-19 ENCOUNTER — Other Ambulatory Visit: Payer: Self-pay | Admitting: Family

## 2023-10-19 DIAGNOSIS — N946 Dysmenorrhea, unspecified: Secondary | ICD-10-CM

## 2023-10-19 NOTE — Telephone Encounter (Signed)
 Copied from CRM [id number]. Topic: Clinical - Medication Refill >> Oct 19, 2023 10:06 AM Jalayah J wrote: Medication: ibuprofen  (ADVIL ) 800 MG tablet  Has the patient contacted their pharmacy? Yes (Agent: If no, request that the patient contact the pharmacy for the refill. If patient does not wish to contact the pharmacy document the reason why and proceed with request.) (Agent: If yes, when and what did the pharmacy advise?)  This is the patient's preferred pharmacy:    [id number] - [place], [place] - [address] AT [address] & [address] [place] [address] Phone: [phone] Fax: [phone]    Is this the correct pharmacy for this prescription? Yes If no, delete pharmacy and type the correct one.   Has the prescription been filled recently? Yes  Is the patient out of the medication? No  Has the patient been seen for an appointment in the last year OR does the patient have an upcoming appointment? Yes  Can we respond through MyChart? Yes  Agent: Please be advised that Rx refills may take up to 3 business days. We ask that you follow-up with your pharmacy.

## 2023-10-23 MED ORDER — IBUPROFEN 800 MG PO TABS: 800.0000 mg | ORAL_TABLET | Freq: Three times a day (TID) | ORAL | 0 refills | Status: AC | PRN

## 2023-10-23 NOTE — Telephone Encounter (Signed)
 Requested medications are due for refill today.  yes  Requested medications are on the active medications list.  yes  Last refill. 06/07/2023 #90 0 rf  Future visit scheduled.   yes  Notes to clinic.  Labs are expired.    Requested Prescriptions  Pending Prescriptions Disp Refills   ibuprofen  (ADVIL ) 800 MG tablet 90 tablet 0     Analgesics:  NSAIDS Failed - 10/23/2023  2:45 PM      Failed - Manual Review: Labs are only required if the patient has taken medication for more than 8 weeks.      Failed - HGB in normal range and within 360 days    Hemoglobin  Date Value Ref Range Status  10/19/2021 12.7 12.0 - 15.0 g/dL Final  96/97/7977 87.5 11.1 - 15.9 g/dL Final         Failed - PLT in normal range and within 360 days    Platelets  Date Value Ref Range Status  10/19/2021 308 150 - 400 K/uL Final  06/17/2020 385 150 - 450 x10E3/uL Final         Failed - HCT in normal range and within 360 days    HCT  Date Value Ref Range Status  10/19/2021 37.7 36.0 - 46.0 % Final   Hematocrit  Date Value Ref Range Status  06/17/2020 39.0 34.0 - 46.6 % Final         Passed - Cr in normal range and within 360 days    Creatinine, Ser  Date Value Ref Range Status  09/19/2023 0.60 0.57 - 1.00 mg/dL Final         Passed - eGFR is 30 or above and within 360 days    GFR calc Af Amer  Date Value Ref Range Status  10/02/2011 >90 >90 mL/min Final    Comment:           The eGFR has been calculated using the CKD EPI equation. This calculation has not been validated in all clinical situations. eGFR's persistently <90 mL/min signify possible Chronic Kidney Disease.   GFR, Estimated  Date Value Ref Range Status  10/19/2021 >60 >60 mL/min Final    Comment:    (NOTE) Calculated using the CKD-EPI Creatinine Equation (2021)    eGFR  Date Value Ref Range Status  09/19/2023 120 >59 mL/min/1.73 Final         Passed - Patient is not pregnant      Passed - Valid encounter within last  12 months    Recent Outpatient Visits           1 month ago Primary hypertension   [facility], Amy J, NP   4 months ago Type 2 diabetes mellitus with hyperglycemia, with long-term current use of insulin  [facility])   [facility], Amy J, NP   7 months ago Type 2 diabetes mellitus with hyperglycemia, with long-term current use of insulin  [facility])   [facility], Amy J, NP   10 months ago Type 2 diabetes mellitus with hyperglycemia, with long-term current use of insulin  [facility])   [facility], Amy J, NP   1 year ago Type 2 diabetes mellitus without complication, with long-term current use of insulin  [facility])   [facility], Greig PARAS, NP

## 2023-10-23 NOTE — Telephone Encounter (Signed)
 Complete

## 2023-10-24 ENCOUNTER — Encounter: Payer: Self-pay | Admitting: Family

## 2023-10-24 ENCOUNTER — Ambulatory Visit (INDEPENDENT_AMBULATORY_CARE_PROVIDER_SITE_OTHER): Admitting: Family

## 2023-10-24 VITALS — BP 139/82 | HR 93 | Temp 98.5°F | Resp 16 | Ht 68.5 in | Wt 314.2 lb

## 2023-10-24 DIAGNOSIS — F32A Depression, unspecified: Secondary | ICD-10-CM

## 2023-10-24 DIAGNOSIS — Z794 Long term (current) use of insulin: Secondary | ICD-10-CM

## 2023-10-24 DIAGNOSIS — Z7984 Long term (current) use of oral hypoglycemic drugs: Secondary | ICD-10-CM

## 2023-10-24 DIAGNOSIS — I10 Essential (primary) hypertension: Secondary | ICD-10-CM | POA: Diagnosis not present

## 2023-10-24 DIAGNOSIS — Z23 Encounter for immunization: Secondary | ICD-10-CM

## 2023-10-24 DIAGNOSIS — E1165 Type 2 diabetes mellitus with hyperglycemia: Secondary | ICD-10-CM

## 2023-10-24 DIAGNOSIS — F418 Other specified anxiety disorders: Secondary | ICD-10-CM

## 2023-10-24 DIAGNOSIS — Z111 Encounter for screening for respiratory tuberculosis: Secondary | ICD-10-CM | POA: Diagnosis not present

## 2023-10-24 MED ORDER — METFORMIN HCL ER 500 MG PO TB24: 1000.0000 mg | ORAL_TABLET | Freq: Every day | ORAL | 0 refills | Status: AC

## 2023-10-24 MED ORDER — AMLODIPINE BESYLATE 5 MG PO TABS
5.0000 mg | ORAL_TABLET | Freq: Every day | ORAL | 0 refills | Status: DC
Start: 2023-10-24 — End: 2024-01-10

## 2023-10-24 MED ORDER — LANTUS SOLOSTAR 100 UNIT/ML ~~LOC~~ SOPN: PEN_INJECTOR | SUBCUTANEOUS | 2 refills | Status: DC

## 2023-10-24 NOTE — Progress Notes (Signed)
 4 week follow up for b/p , needs tb for job ( Blood work), Patient scored a 10 on the GAD-7

## 2023-10-24 NOTE — Progress Notes (Signed)
 Patient ID: SHIRAH ROSEMAN, female    DOB: 12/02/1985  MRN: [id number]  CC: Chronic Conditions Follow-Up  Subjective: Bertice Risse is a 38 y.o. female who presents for chronic conditions follow-up.   Her concerns today include:  - Doing well on Amlodipine , no issues/concerns. She does not complain of red flag symptoms such as but not limited to chest pain, shortness of breath, worst headache of life, nausea/vomiting.  - Doing well on Metformin  XR and Insulin  Glargine, no issues/concerns. Denies red flag symptoms associated with diabetes.  - Anxiety depression persisting. States she did not receive call from Psychiatry. She denies thoughts of self-harm, suicidal ideations, homicidal ideations. 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  Patient Active Problem List   Diagnosis Date Noted   Cholecystitis 10/18/2021   Prediabetes 06/18/2020   Hyperlipidemia 06/18/2020   Morbid obesity (HCC) 05/15/2013     Current Outpatient Medications on File Prior to Visit  Medication Sig Dispense Refill   Accu-Chek Softclix Lancets lancets use as directed to check blood sugar 100 each 0   atorvastatin  (LIPITOR) 20 MG tablet TAKE 1 TABLET(20 MG) BY MOUTH DAILY 90 tablet 0   blood glucose meter kit and supplies Use up to four times daily as directed. 1 each 0   glucose blood test strip use to check blood sugars up to four times daily 100 each 0   ibuprofen  (ADVIL ) 800 MG tablet Take 1 tablet (800 mg total) by mouth every 8 (eight) hours as needed. 90 tablet 0   Insulin  Pen Needle (PENTIPS) 32G X 4 MM MISC Use to inject insulin  as directed. 100 each 0   norethindrone-ethinyl estradiol-iron (JUNEL FE 1.5/30) 1.5-30 MG-MCG tablet Take 1 tablet by mouth daily. 84 tablet 4   polyethylene glycol powder (GLYCOLAX /MIRALAX ) 17 GM/SCOOP powder TAKE 17G BY MOUTH AS NEEDED FOR MILD CONSTIPATION OR MODERATE CONSTIPATION 17 g 0   Continuous Blood Gluc Receiver (FREESTYLE LIBRE 2 READER) DEVI Use to check blood sugar TID. 1  each 0   Continuous Blood Gluc Sensor (FREESTYLE LIBRE 2 SENSOR) MISC Use to check blood sugar TID. 2 each 2   Dulaglutide  (TRULICITY ) 1.5 MG/0.5ML SOAJ Inject 1.5 mg into the skin once a week. (Patient not taking: Reported on 10/24/2023) 6 mL 0   No current facility-administered medications on file prior to visit.    Allergies  Allergen Reactions   Lisinopril  Swelling    lips    Social History   Socioeconomic History   Marital status: Single    Spouse name: n/a   Number of children: 0   Years of education: Associates   Highest education level: Associate degree: academic program  Occupational History   Occupation: TODDLER Magazine features editor: [employer]  Tobacco Use   Smoking status: Former    Passive exposure: Past   Smokeless tobacco: Never  Advertising account planner   Vaping status: Former  Substance and Sexual Activity   Alcohol use: Not Currently   Drug use: No   Sexual activity: Not Currently    Partners: Male    Birth control/protection: Pill    Comment: intercourse age 18, less than 5 sexual pafrtners, CURRENT PARTNER- 9 YRS  Other Topics Concern   Not on file  Social History Narrative   Lives with her boyfriend and their daughter.   Social Drivers of Health   Financial Resource Strain: Medium Risk (10/20/2023)   Overall Financial Resource Strain (CARDIA)    Difficulty of Paying Living Expenses: Somewhat hard  Food Insecurity: Food Insecurity Present (10/20/2023)   Hunger Vital Sign    Worried About Running Out of Food in the Last Year: Sometimes true    Ran Out of Food in the Last Year: Sometimes true  Transportation Needs: No Transportation Needs (10/20/2023)   PRAPARE - Administrator, Civil Service (Medical): No    Lack of Transportation (Non-Medical): No  Physical Activity: Inactive (10/20/2023)   Exercise Vital Sign    Days of Exercise per Week: 0 days    Minutes of Exercise per Session: Not on file  Stress: Stress Concern Present (10/20/2023)    [employer] of Occupational Health - Occupational Stress Questionnaire    Feeling of Stress: To some extent  Social Connections: Socially Isolated (10/20/2023)   Social Connection and Isolation Panel    Frequency of Communication with Friends and Family: Once a week    Frequency of Social Gatherings with Friends and Family: Never    Attends Religious Services: 1 to 4 times per year    Active Member of [employer] or Organizations: No    Attends Engineer, structural: Not on file    Marital Status: Never married  Intimate Partner Violence: Not At Risk (09/19/2023)   Humiliation, Afraid, Rape, and Kick questionnaire    Fear of Current or Ex-Partner: No    Emotionally Abused: No    Physically Abused: No    Sexually Abused: No    Family History  Problem Relation Age of Onset   Diabetes Mother    Hypertension Mother    Diabetes Maternal Grandmother    Hypertension Maternal Grandmother    Other Neg Hx     Past Surgical History:  Procedure Laterality Date   CHOLECYSTECTOMY N/A 10/19/2021   Procedure: LAPAROSCOPIC CHOLECYSTECTOMY;  Surgeon: Dasie Leonor CROME, MD;  Location: [facility] OR;  Service: General;  Laterality: N/A;    ROS: Review of Systems Negative except as stated above  PHYSICAL EXAM: BP 139/82   Pulse 93   Temp 98.5 F (36.9 C) (Oral)   Resp 16   Ht 5' 8.5 (1.74 m)   Wt (!) 314 lb 3.2 oz (142.5 kg)   LMP 10/18/2023 (Exact Date)   SpO2 97%   BMI 47.08 kg/m   Physical Exam HENT:     Head: Normocephalic and atraumatic.     Nose: Nose normal.     Mouth/Throat:     Mouth: Mucous membranes are moist.     Pharynx: Oropharynx is clear.  Eyes:     Extraocular Movements: Extraocular movements intact.     Conjunctiva/sclera: Conjunctivae normal.     Pupils: Pupils are equal, round, and reactive to light.  Cardiovascular:     Rate and Rhythm: Normal rate and regular rhythm.     Pulses: Normal pulses.     Heart sounds: Normal heart sounds.  Pulmonary:     Effort:  Pulmonary effort is normal.     Breath sounds: Normal breath sounds.  Musculoskeletal:        General: Normal range of motion.     Cervical back: Normal range of motion and neck supple.  Neurological:     General: No focal deficit present.     Mental Status: She is alert and oriented to person, place, and time.  Psychiatric:        Mood and Affect: Mood normal.        Behavior: Behavior normal.      ASSESSMENT AND PLAN: 1. Primary hypertension (Primary) -  Continue Amlodipine  as prescribed.  - Patient intolerant to Lisinopril . - Counseled on blood pressure goal of less than 130/80, low-sodium, DASH diet, medication compliance, and 150 minutes of moderate intensity exercise per week as tolerated. Counseled on medication adherence and adverse effects. - Follow-up with primary provider in 3 months or sooner if needed. - amLODipine  (NORVASC ) 5 MG tablet; Take 1 tablet (5 mg total) by mouth daily.  Dispense: 90 tablet; Refill: 0  2. Type 2 diabetes mellitus with hyperglycemia, with long-term current use of insulin  (HCC) - Continue Metformin  XR and Insulin  Glargine as prescribed.  - Hemoglobin A1c result pending.  - Discussed the importance of healthy eating habits, low-carbohydrate diet, low-sugar diet, regular aerobic exercise (at least 150 minutes a week as tolerated) and medication compliance to achieve or maintain control of diabetes. Counseled on medication adherence/adverse effects.  - Follow-up with primary provider as scheduled. - insulin  glargine (LANTUS  SOLOSTAR) 100 UNIT/ML Solostar Pen; ADMINISTER 35 UNITS UNDER THE SKIN DAILY  Dispense: 15 mL; Refill: 2 - metFORMIN  (GLUCOPHAGE -XR) 500 MG 24 hr tablet; Take 2 tablets (1,000 mg total) by mouth daily with breakfast.  Dispense: 180 tablet; Refill: 0 - Hemoglobin A1c  3. Anxiety and depression - Patient denies thoughts of self-harm, suicidal ideations, homicidal ideations. - Patient declined pharmacological therapy.  - Referral  to Psychiatry for evaluation/management. - Follow-up with primary provider as scheduled. - Ambulatory referral to Psychiatry  4. Visit for TB skin test - Routine screening.  - QuantiFERON-TB Gold Plus  5. Immunization due - Administered. - HPV 9-valent vaccine,Recombinat   Patient was given the opportunity to ask questions.  Patient verbalized understanding of the plan and was able to repeat key elements of the plan. Patient was given clear instructions to go to Emergency Department or return to medical center if symptoms don't improve, worsen, or new problems develop.The patient verbalized understanding.   Orders Placed This Encounter  Procedures   HPV 9-valent vaccine,Recombinat   QuantiFERON-TB Gold Plus   Hemoglobin A1c   Ambulatory referral to Psychiatry     Requested Prescriptions   Signed Prescriptions Disp Refills   amLODipine  (NORVASC ) 5 MG tablet 90 tablet 0    Sig: Take 1 tablet (5 mg total) by mouth daily.   insulin  glargine (LANTUS  SOLOSTAR) 100 UNIT/ML Solostar Pen 15 mL 2    Sig: ADMINISTER 35 UNITS UNDER THE SKIN DAILY   metFORMIN  (GLUCOPHAGE -XR) 500 MG 24 hr tablet 180 tablet 0    Sig: Take 2 tablets (1,000 mg total) by mouth daily with breakfast.    Follow-up with primary provider as scheduled.  Greig JINNY Drones, NP

## 2023-10-25 ENCOUNTER — Ambulatory Visit: Payer: Self-pay | Admitting: Family

## 2023-10-25 DIAGNOSIS — Z111 Encounter for screening for respiratory tuberculosis: Secondary | ICD-10-CM

## 2023-10-25 DIAGNOSIS — Z794 Long term (current) use of insulin: Secondary | ICD-10-CM

## 2023-10-25 LAB — HEMOGLOBIN A1C
Est. average glucose Bld gHb Est-mCnc: 186 mg/dL
Hgb A1c MFr Bld: 8.1 % — ABNORMAL HIGH (ref 4.8–5.6)

## 2023-10-25 LAB — QUANTIFERON-TB GOLD PLUS

## 2023-10-25 MED ORDER — LANTUS SOLOSTAR 100 UNIT/ML ~~LOC~~ SOPN: PEN_INJECTOR | SUBCUTANEOUS | 2 refills | Status: AC

## 2023-10-26 ENCOUNTER — Ambulatory Visit: Admitting: *Deleted

## 2023-10-26 LAB — TB SKIN TEST
Induration: 0 mm
TB Skin Test: NEGATIVE

## 2023-10-26 NOTE — Progress Notes (Signed)
 Patient came in for TB reading results

## 2023-10-27 NOTE — Progress Notes (Signed)
 Noted

## 2023-10-30 ENCOUNTER — Ambulatory Visit: Admitting: Physical Medicine and Rehabilitation

## 2023-10-30 ENCOUNTER — Other Ambulatory Visit (INDEPENDENT_AMBULATORY_CARE_PROVIDER_SITE_OTHER): Payer: Self-pay

## 2023-10-30 ENCOUNTER — Encounter: Payer: Self-pay | Admitting: Physical Medicine and Rehabilitation

## 2023-10-30 DIAGNOSIS — M47817 Spondylosis without myelopathy or radiculopathy, lumbosacral region: Secondary | ICD-10-CM

## 2023-10-30 DIAGNOSIS — M545 Low back pain, unspecified: Secondary | ICD-10-CM

## 2023-10-30 DIAGNOSIS — G8929 Other chronic pain: Secondary | ICD-10-CM

## 2023-10-30 DIAGNOSIS — M7918 Myalgia, other site: Secondary | ICD-10-CM | POA: Diagnosis not present

## 2023-10-30 MED ORDER — MELOXICAM 15 MG PO TABS: 15.0000 mg | ORAL_TABLET | Freq: Every day | ORAL | 0 refills | Status: DC

## 2023-10-30 NOTE — Progress Notes (Unsigned)
 Pain Scale   Average Pain 5 Patient advising she has lower back pain that increase when sitting and decreases when she walks.        +Driver, -BT, -Dye Allergies.

## 2023-10-30 NOTE — Progress Notes (Unsigned)
 Core Outcome Measures Index (COMI) Back Score  Average Pain 4  COMI Score 30%

## 2023-10-30 NOTE — Progress Notes (Unsigned)
 Jasmine Krause - 38 y.o. female MRN [id number]  Date of birth: 1985/12/23  Office Visit Note: Visit Date: 10/30/2023 PCP: Lorren Greig PARAS, NP Referred by: Lorren Greig PARAS, NP  Subjective: Chief Complaint  Patient presents with   Lower Back - Pain   HPI: Jasmine Krause is a 39 y.o. female who comes in today per the request of Greig Lorren, NP for evaluation of chronic, worsening and severe bilateral lower back pain. Pain started about 1 year ago. Her pain worsens with prolonged sitting and bending. She describes pain as sore, tight and aching sensation, currently rates as 5 out of 10. Some relief of pain with home exercise regimen, rest and use of medications. No relief of pain with intramuscular Toradol /Kenalog  injection with PCP in December of 2024. No history of formal physical therapy. No prior imaging of lumbar spine. No history of lumbar surgery/injections. She is currently working full time with childcare facility. Patient denies focal weakness, numbness and tingling. No recent trauma or falls.   Patients course is complicated by morbid obesity and diabetes mellitus. Recent A1C was 8.1 on 10/24/2023.      Review of Systems  Musculoskeletal:  Positive for back pain and myalgias.  Neurological:  Negative for tingling, sensory change, focal weakness and weakness.  All other systems reviewed and are negative.  Otherwise per HPI.  Assessment & Plan: Visit Diagnoses:    ICD-10-CM   1. Chronic bilateral low back pain without sciatica  M54.50 XR Lumbar Spine 2-3 Views   G89.29 Ambulatory referral to Physical Therapy    2. Myofascial pain syndrome  M79.18 Ambulatory referral to Physical Therapy    3. Spondylosis without myelopathy or radiculopathy, lumbosacral region  M47.817 Ambulatory referral to Physical Therapy       Plan: Findings:  Chronic, worsening and severe bilateral axial lower back pain. No radicular symptoms down the legs. Patient continues to have severe pain despite  good conservative therapies such as home exercise regimen, rest and use of medications. I obtained lumbar radiographs in the office today that shows increased lordosis, there is foraminal stenosis and disc height loss at L5-S1 that is advanced for her age. We discussed treatment plan in detail today. Patients clinical presentation and exam are consistent with more myofascial pain syndrome/discogenic back pain. Myofascial tenderness noted upon palpation of bilateral lumbar paraspinal regions today. I think she would benefit from short course of formal physical therapy with a focus on core strengthening and manual treatments. I would like PT to work with her to develop home exercise regimen. Would also consider gray ramus communicans nerve block should her pain persist. She has no questions at this time. We will see her back in approximately 8 weeks for re-evaluation. No red flag symptoms noted upon exam today.     Meds & Orders:  Meds ordered this encounter  Medications   meloxicam  (MOBIC ) 15 MG tablet    Sig: Take 1 tablet (15 mg total) by mouth daily.    Dispense:  30 tablet    Refill:  0    Orders Placed This Encounter  Procedures   XR Lumbar Spine 2-3 Views   Ambulatory referral to Physical Therapy    Follow-up: Return for 8 week follow up for re-evaluation.   Procedures: No procedures performed      Clinical History: No specialty comments available.   She reports that she has quit smoking. She has been exposed to tobacco smoke. She has never used smokeless tobacco.  Recent Labs    06/19/23 0943 09/19/23 0929 10/24/23 0921  HGBA1C 6.8 7.6* 8.1*    Objective:  VS:  HT:    WT:   BMI:     BP:   HR: bpm  TEMP: ( )  RESP:  Physical Exam Vitals and nursing note reviewed.  HENT:     Head: Normocephalic and atraumatic.     Right Ear: External ear normal.     Left Ear: External ear normal.     Nose: Nose normal.     Mouth/Throat:     Mouth: Mucous membranes are moist.   Eyes:     Extraocular Movements: Extraocular movements intact.  Cardiovascular:     Rate and Rhythm: Normal rate.     Pulses: Normal pulses.  Pulmonary:     Effort: Pulmonary effort is normal.  Abdominal:     General: Abdomen is flat. There is no distension.  Musculoskeletal:        General: Tenderness present.     Cervical back: Normal range of motion.     Comments: Patient rises from seated position to standing without difficulty. Pain noted with facet loading and lumbar extension. 5/5 strength noted with bilateral hip flexion, knee flexion/extension, ankle dorsiflexion/plantarflexion and EHL. No clonus noted bilaterally. No pain upon palpation of greater trochanters. No pain with internal/external rotation of bilateral hips. Sensation intact bilaterally. Myofascial tenderness noted upon palpation of bilateral lumbar paraspinal regions. Negative slump test bilaterally. Ambulates without aid, gait steady.     Skin:    General: Skin is warm and dry.     Capillary Refill: Capillary refill takes less than 2 seconds.  Neurological:     General: No focal deficit present.     Mental Status: She is alert and oriented to person, place, and time.  Psychiatric:        Mood and Affect: Mood normal.        Behavior: Behavior normal.     Ortho Exam  Imaging: XR Lumbar Spine 2-3 Views Result Date: 10/30/2023 AP and lateral radiographs of lumbar spine show increased lordosis, normal segmentation, there is foraminal stenosis and disc height loss at L5-S1. No spondylolisthesis. No pars defects. There are periarticular osteophytes noted to bilateral hips, no significant joint space narrowing.    Past Medical/Family/Surgical/Social History: Medications & Allergies reviewed per EMR, new medications updated. Patient Active Problem List   Diagnosis Date Noted   Cholecystitis 10/18/2021   Prediabetes 06/18/2020   Hyperlipidemia 06/18/2020   Morbid obesity (HCC) 05/15/2013   Past Medical History:   Diagnosis Date   Abnormal Pap smear of cervix    09-23-20 neg HPV HR+   High cholesterol    Obesity    Family History  Problem Relation Age of Onset   Diabetes Mother    Hypertension Mother    Diabetes Maternal Grandmother    Hypertension Maternal Grandmother    Other Neg Hx    Past Surgical History:  Procedure Laterality Date   CHOLECYSTECTOMY N/A 10/19/2021   Procedure: LAPAROSCOPIC CHOLECYSTECTOMY;  Surgeon: Dasie Leonor CROME, MD;  Location: [facility] OR;  Service: General;  Laterality: N/A;   Social History   Occupational History   Occupation: TODDLER TEACHER    Employer: [employer]  Tobacco Use   Smoking status: Former    Passive exposure: Past   Smokeless tobacco: Never  Vaping Use   Vaping status: Former  Substance and Sexual Activity   Alcohol use: Not Currently   Drug use: No  Sexual activity: Not Currently    Partners: Male    Birth control/protection: Pill    Comment: intercourse age 12, less than 5 sexual pafrtners, CURRENT PARTNER- 9 YRS

## 2023-10-31 ENCOUNTER — Encounter (HOSPITAL_COMMUNITY): Payer: Self-pay | Admitting: Family

## 2023-10-31 ENCOUNTER — Ambulatory Visit (HOSPITAL_COMMUNITY): Payer: Self-pay | Admitting: Family

## 2023-10-31 VITALS — BP 141/84 | HR 88 | Resp 16 | Wt 317.4 lb

## 2023-10-31 DIAGNOSIS — F4323 Adjustment disorder with mixed anxiety and depressed mood: Secondary | ICD-10-CM | POA: Diagnosis not present

## 2023-10-31 NOTE — Progress Notes (Signed)
 Psychiatric Initial Adult Assessment   Patient Identification: Jasmine Krause MRN:  [id number] Date of Evaluation:  10/31/2023 Referral Source: PCP, Greig Drones NP Chief Complaint:  feeling stressed  Visit Diagnosis:    ICD-10-CM   1. Adjustment disorder with mixed anxiety and depressed mood  F43.23       History of Present Illness:  Jasmine Krause 38 year old African-American female presents to establish care.  States she was referred by her primary care provider for therapy services.  She denied history related to depression or anxiety.  States  I told her I did not want any medications.  Reports some stress related to her current employer.  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  States she has plans to study human services.  She reports her main stressors are related to work related issues, irritability, decreased energy and being stressed out.   Jasmine Krause did share situation related to old employee (mentor/boss)  was involved in a murder/suicide roughly 2 years ago.  Stated that she was her direct reports felt very close to this person as she helped her out during her career.  Reports that may have affected her mood and emotions.   Jasmine Krause stated she has 21 year old child.  Denied family history related to mental illness.  She denied previous inpatient admissions.  Denied that she is ever been on any psychotropic medications prior.  Denied previous suicide attempts or self injures behaviors.  Reports occasional social drinker.  Has a medical history related to hypertension, diabetes and dyslipidemia.  States taking and tolerating medications well.   I take 10 pills daily I do not need another 1.'  She reports a good appetite.  States interrupted sleep.  This provider inquired about CPAP and snoring will defer to primary care.  During evaluation Jasmine Krause is sitting ; she is alert/oriented x 4;  calm/cooperative; and mood congruent with affect.  Patient is speaking in a clear tone at moderate volume, and normal pace; with good eye contact. Her thought process is coherent and relevant; There is no indication that she is currently responding to internal/external stimuli or experiencing delusional thought content.  Patient denies suicidal/self-harm/homicidal ideation, psychosis, and paranoia.  Patient has remained calm throughout assessment and has answered questions appropriately.   Associated Signs/Symptoms: Depression Symptoms:  difficulty concentrating, anxiety, (Hypo) Manic Symptoms:  Distractibility, Anxiety Symptoms:  Excessive Worry, Psychotic Symptoms:  Hallucinations: None PTSD Symptoms: NA  Past Psychiatric History:   Previous Psychotropic Medications: No   Substance Abuse History in the last 12 months:  No.  Consequences of Substance Abuse: NA  Past Medical History:  Past Medical History:  Diagnosis Date   Abnormal Pap smear of cervix    09-23-20 neg HPV HR+   High cholesterol    Obesity     Past Surgical History:  Procedure Laterality Date   CHOLECYSTECTOMY N/A 10/19/2021   Procedure: LAPAROSCOPIC CHOLECYSTECTOMY;  Surgeon: Dasie Leonor LITTIE, MD;  Location: [facility] OR;  Service: General;  Laterality: N/A;    Family Psychiatric History: Denied  Family History:  Family History  Problem Relation Age of Onset   Diabetes Mother    Hypertension Mother    Diabetes Maternal Grandmother    Hypertension Maternal Grandmother    Other Neg Hx     Social History:   Social History   Socioeconomic History   Marital status: Single    Spouse name: n/a  Number of children: 0   Years of education: Associates   Highest education level: Associate degree: academic program  Occupational History   Occupation: [employer] features editor: [employer]  Tobacco Use   Smoking status: Former    Passive exposure: Past   Smokeless tobacco: Never  Advertising account planner   Vaping  status: Former  Substance and Sexual Activity   Alcohol use: Not Currently   Drug use: No   Sexual activity: Not Currently    Partners: Male    Birth control/protection: Pill    Comment: intercourse age 41, less than 5 sexual pafrtners, CURRENT PARTNER- 9 YRS  Other Topics Concern   Not on file  Social History Narrative   Lives with her boyfriend and their daughter.   Social Drivers of Health   Financial Resource Strain: Medium Risk (10/20/2023)   Overall Financial Resource Strain (CARDIA)    Difficulty of Paying Living Expenses: Somewhat hard  Food Insecurity: Food Insecurity Present (10/20/2023)   Hunger Vital Sign    Worried About Running Out of Food in the Last Year: Sometimes true    Ran Out of Food in the Last Year: Sometimes true  Transportation Needs: No Transportation Needs (10/20/2023)   PRAPARE - Administrator, Civil Service (Medical): No    Lack of Transportation (Non-Medical): No  Physical Activity: Inactive (10/20/2023)   Exercise Vital Sign    Days of Exercise per Week: 0 days    Minutes of Exercise per Session: Not on file  Stress: Stress Concern Present (10/20/2023)   [employer] of Occupational Health - Occupational Stress Questionnaire    Feeling of Stress: To some extent  Social Connections: Socially Isolated (10/20/2023)   Social Connection and Isolation Panel    Frequency of Communication with Friends and Family: Once a week    Frequency of Social Gatherings with Friends and Family: Never    Attends Religious Services: 1 to 4 times per year    Active Member of [employer] or Organizations: No    Attends Engineer, structural: Not on file    Marital Status: Never married    Additional Social History:   Allergies:   Allergies  Allergen Reactions   Lisinopril  Swelling    lips    Metabolic Disorder Labs: Lab Results  Component Value Date   HGBA1C 8.1 (H) 10/24/2023   MPG 254.65 10/19/2021   No results found for: PROLACTIN Lab  Results  Component Value Date   CHOL 175 12/16/2022   TRIG 71 12/16/2022   HDL 62 12/16/2022   CHOLHDL 2.8 12/16/2022   LDLCALC 100 (H) 12/16/2022   LDLCALC 107 (H) 12/14/2021   Lab Results  Component Value Date   TSH 0.820 12/14/2021    Therapeutic Level Labs: No results found for: LITHIUM No results found for: CBMZ No results found for: VALPROATE  Current Medications: Current Outpatient Medications  Medication Sig Dispense Refill   Accu-Chek Softclix Lancets lancets use as directed to check blood sugar 100 each 0   amLODipine  (NORVASC ) 5 MG tablet Take 1 tablet (5 mg total) by mouth daily. 90 tablet 0   atorvastatin  (LIPITOR) 20 MG tablet TAKE 1 TABLET(20 MG) BY MOUTH DAILY 90 tablet 0   blood glucose meter kit and supplies Use up to four times daily as directed. 1 each 0   Continuous Blood Gluc Receiver (FREESTYLE LIBRE 2 READER) DEVI Use to check blood sugar TID. 1 each 0   Continuous Blood Gluc Sensor (  FREESTYLE LIBRE 2 SENSOR) MISC Use to check blood sugar TID. 2 each 2   Dulaglutide  (TRULICITY ) 1.5 MG/0.5ML SOAJ Inject 1.5 mg into the skin once a week. (Patient not taking: Reported on 10/24/2023) 6 mL 0   glucose blood test strip use to check blood sugars up to four times daily 100 each 0   ibuprofen  (ADVIL ) 800 MG tablet Take 1 tablet (800 mg total) by mouth every 8 (eight) hours as needed. 90 tablet 0   insulin  glargine (LANTUS  SOLOSTAR) 100 UNIT/ML Solostar Pen ADMINISTER 40 UNITS UNDER THE SKIN DAILY. 15 mL 2   Insulin  Pen Needle (PENTIPS) 32G X 4 MM MISC Use to inject insulin  as directed. 100 each 0   meloxicam  (MOBIC ) 15 MG tablet Take 1 tablet (15 mg total) by mouth daily. 30 tablet 0   metFORMIN  (GLUCOPHAGE -XR) 500 MG 24 hr tablet Take 2 tablets (1,000 mg total) by mouth daily with breakfast. 180 tablet 0   norethindrone-ethinyl estradiol-iron (JUNEL FE 1.5/30) 1.5-30 MG-MCG tablet Take 1 tablet by mouth daily. 84 tablet 4   polyethylene glycol powder  (GLYCOLAX /MIRALAX ) 17 GM/SCOOP powder TAKE 17G BY MOUTH AS NEEDED FOR MILD CONSTIPATION OR MODERATE CONSTIPATION 17 g 0   No current facility-administered medications for this visit.    Musculoskeletal: Strength & Muscle Tone: within normal limits Gait & Station: normal Patient leans: N/A  Psychiatric Specialty Exam: Review of Systems  Blood pressure (!) 141/84, pulse 88, resp. rate 16, weight (!) 317 lb 6.4 oz (144 kg), last menstrual period 10/18/2023.Body mass index is 47.56 kg/m.  General Appearance: Casual  Eye Contact:  Good  Speech:  Clear and Coherent  Volume:  Normal  Mood:  Anxious and Depressed  Affect:  Congruent  Thought Process:  Coherent  Orientation:  Full (Time, Place, and Person)  Thought Content:  Logical  Suicidal Thoughts:  No  Homicidal Thoughts:  No  Memory:  Immediate;   Good Recent;   Good  Judgement:  Good  Insight:  Good  Psychomotor Activity:  Normal  Concentration:  Concentration: Good  Recall:  Good  Fund of Knowledge:Good  Language: Good  Akathisia:  No  Handed:  Right  AIMS (if indicated):  not done  Assets:  Communication Skills Desire for Improvement Resilience Social Support  ADL's:  Intact  Cognition: WNL  Sleep:  Good   Screenings: AUDIT    Flowsheet Row Office Visit from 10/24/2023 in [facility] at [facility] Office Visit from 03/21/2023 in [facility] at [facility]  Alcohol Use Disorder Identification Test Final Score (AUDIT) 4  3    GAD-7    Flowsheet Row Office Visit from 10/24/2023 in [facility] at [facility] Office Visit from 09/19/2023 in [facility] at [facility] Office Visit from 12/16/2022 in [facility] Care at [facility] Office Visit from 12/14/2021 in [facility] at [facility] Telemedicine from 04/24/2020 in [facility] Primary Care at [facility]  Total GAD-7 Score 10 11 10 15 6    PHQ2-9    Flowsheet Row Office Visit  from 10/31/2023 [facility] Office Visit from 10/24/2023 in [facility] at [facility] Office Visit from 09[facility]19/2023 in [facility] at [facility] Office Visit from 03/21/2023 in [facility] Care at [facility] Office Visit from 12/16/2022 in [facility] Care at [facility] Total Score 4 0 3 0  4  PHQ-9 Total Score 16 4 13  -- 14   Flowsheet Row Office Visit from 10/31/2023 in [facility] UC from 05/12/2022 in [facility] at [facility] Visit from 12/14/2021 in [facility] at [facility]  C-SSRS RISK CATEGORY Error: Question 6 not populated No Risk No Risk    Assessment and Plan: Jasmine Krause is a 38 year old African-American female who presents to establish care.  States she was referred by her primary care provider for therapy services.  She reports she is not interested in any psychotropic medications at this time.  Did endorse sleep disturbance and swelling depression symptoms.  States feeling stressed and overwhelmed overall.  No concerns related to suicidal or homicidal ideations.  Documented PHQ-9 16 GAD-7 7.  Patient is interested in following up with talk therapy.  She may follow-up as needed.   Collaboration of Care: Other provided with outpatient resources for therapy services  Patient/Guardian was advised Release of Information must be obtained prior to any record release in order to collaborate their care with an outside provider. Patient/Guardian was advised if they have not already done so to contact the registration department to sign all necessary forms in order for us  to release information regarding their care.   Consent: Patient/Guardian gives verbal consent for treatment and assignment of benefits for services provided during this visit. Patient/Guardian expressed understanding and agreed to proceed.   Jasmine LOISE Kerns, NP 7/15/20251:23 PM

## 2023-11-22 ENCOUNTER — Ambulatory Visit

## 2023-11-30 ENCOUNTER — Ambulatory Visit: Admitting: Rehabilitative and Restorative Service Providers"

## 2023-11-30 NOTE — Therapy (Incomplete)
 OUTPATIENT PHYSICAL THERAPY THORACOLUMBAR EVALUATION   Patient Name: Jasmine Krause MRN: [id number] DOB:Sep 08, 1985, 38 y.o., female Today's Date: 11/30/2023  END OF SESSION:   Past Medical History:  Diagnosis Date   Abnormal Pap smear of cervix    09-23-20 neg HPV HR+   High cholesterol    Obesity    Past Surgical History:  Procedure Laterality Date   CHOLECYSTECTOMY N/A 10/19/2021   Procedure: LAPAROSCOPIC CHOLECYSTECTOMY;  Surgeon: Dasie Leonor LITTIE, MD;  Location[facility] OR;  Service: General;  Laterality: N/A;   Patient Active Problem List   Diagnosis Date Noted   Cholecystitis 10/18/2021   Prediabetes 06/18/2020   Hyperlipidemia 06/18/2020   Morbid obesity (HCC) 05/15/2013    PCP: Lorren Greig PARAS, NP  REFERRING PROVIDER: Trudy Duwaine BRAVO, NP  REFERRING DIAG:  M54.50,G89.29 (ICD-10-CM) - Chronic bilateral low back pain without sciatica  M79.18 (ICD-10-CM) - Myofascial pain syndrome  M47.817 (ICD-10-CM) - Spondylosis without myelopathy or radiculopathy, lumbosacral region    Rationale for Evaluation and Treatment: Rehabilitation  THERAPY DIAG:  No diagnosis found.  ONSET DATE: referral date:   SUBJECTIVE:                                                                                                                                                                                           SUBJECTIVE STATEMENT: ***  PERTINENT HISTORY:  ***  PAIN:  NPRS scale: ***/10 Pain location: *** Pain description: *** Aggravating factors: *** Relieving factors: ***  PRECAUTIONS: {Therapy precautions:24002}  WEIGHT BEARING RESTRICTIONS: {Yes ***/No:24003}  FALLS:  Has patient fallen in last 6 months? {fallsyesno:27318}  LIVING ENVIRONMENT: Lives with: {OPRC lives with:25569::lives with their family} Lives in: {Lives in:25570} Stairs: {opstairs:27293} Has following equipment at home: {Assistive devices:23999}  OCCUPATION: ***  PLOF: Independent  PATIENT  GOALS: ***  Next MD Visit:    OBJECTIVE:   DIAGNOSTIC FINDINGS:  ***  PATIENT SURVEYS:  Patient-Specific Activity Scoring Scheme  0 represents "unable to perform." 10 represents "able to perform at prior level. 0 1 2 3 4 5 6 7 8 9  10 (Date and Score)   Activity Eval     1. ***      2. ***      3. ***    4.    5.    Score ***    Total score = sum of the activity scores/number of activities Minimum detectable change (90%CI) for average score = 2 points Minimum detectable change (90%CI) for single activity score = 3 points  SCREENING FOR RED FLAGS: Bowel or bladder incontinence: {Yes/No:304960894} Cauda equina syndrome: {Yes/No:304960894}  COGNITION: Overall cognitive status: [facility]  normal      SENSATION: {sensation:27233}  MUSCLE LENGTH: Hamstrings: Right *** deg; Left *** deg Debby test: Right *** deg; Left *** deg  POSTURE:  {posture:25561}  PALPATION: ***  LUMBAR ROM:   Directional Preference Assessment: Centralization: Peripheralization:   AROM eval  Flexion   Extension   Right lateral flexion   Left lateral flexion   Right rotation   Left rotation    (Blank rows = not tested)  LOWER EXTREMITY ROM:     {AROM/PROM:27142}  Right eval Left eval  Hip flexion    Hip extension    Hip abduction    Hip adduction    Hip internal rotation    Hip external rotation    Knee flexion    Knee extension    Ankle dorsiflexion    Ankle plantarflexion    Ankle inversion    Ankle eversion     (Blank rows = not tested)  LOWER EXTREMITY MMT:    MMT Right eval Left eval  Hip flexion    Hip extension    Hip abduction    Hip adduction    Hip internal rotation    Hip external rotation    Knee flexion    Knee extension    Ankle dorsiflexion    Ankle plantarflexion    Ankle inversion    Ankle eversion     (Blank rows = not tested)  LUMBAR SPECIAL TESTS:  {lumbar special test:25242}  FUNCTIONAL TESTS:  {Functional  tests:24029}  GAIT:                                                                                                                                                                                                                   TODAY'S TREATMENT:                                                                                                         DATE: ***  Therex:    HEP instruction/performance c cues for techniques, handout provided.  Trial set performed of each for comprehension and symptom  assessment.  See below for exercise list  PATIENT EDUCATION:  Education details: HEP, POC Person educated: Patient Education method: Explanation, Demonstration, Verbal cues, and Handouts Education comprehension: verbalized understanding, returned demonstration, and verbal cues required  HOME EXERCISE PROGRAM: ***  ASSESSMENT:  CLINICAL IMPRESSION: Patient is a *** y.o. who comes to clinic with complaints of ***pain with mobility, strength and movement coordination deficits that impair their ability to perform usual daily and recreational functional activities without increase difficulty/symptoms at this time.  Patient to benefit from skilled PT services to address impairments and limitations to improve to previous level of function without restriction secondary to condition.   OBJECTIVE IMPAIRMENTS: {opptimpairments:25111}.   ACTIVITY LIMITATIONS: {activitylimitations:27494}  PARTICIPATION LIMITATIONS: {participationrestrictions:25113}  PERSONAL FACTORS: {Personal factors:25162} are also affecting patient's functional outcome.   REHAB POTENTIAL: {rehabpotential:25112}  CLINICAL DECISION MAKING: {clinical decision making:25114}  EVALUATION COMPLEXITY: {Evaluation complexity:25115}   GOALS: Goals reviewed with patient? Yes  SHORT TERM GOALS: (target date for Short term goals are 3 weeks ***)  1. Patient will demonstrate independent use of home exercise program to maintain progress  from in clinic treatments.  Goal status: New  LONG TERM GOALS: (target dates for all long term goals are 10 weeks  *** )   1. Patient will demonstrate/report pain at worst less than or equal to 2/10 to facilitate minimal limitation in daily activity secondary to pain symptoms.  Goal status: New   2. Patient will demonstrate independent use of home exercise program to facilitate ability to maintain/progress functional gains from skilled physical therapy services.  Goal status: New   3. Patient will demonstrate Patient specific functional scale avg > or = *** to indicate reduced disability due to condition.   Goal status: New   4. Patient will demonstrate lumbar extension 100 % WFL s symptoms to facilitate upright standing, walking posture at PLOF s limitation.  Goal status: New   5.  ***  Goal status: New   6.  *** Goal status: New   7.  *** Goal Status: New  PLAN:  PT FREQUENCY: 1-2x/week  PT DURATION: 10 weeks  PLANNED INTERVENTIONS: Can include 02853- PT Re-evaluation, 97110-Therapeutic exercises, 97530- Therapeutic activity, [id number]- Neuromuscular re-education, 97535- Self Care, 97140- Manual therapy, [phone]- Gait training, [phone]- Orthotic Fit/training, [phone]- Canalith repositioning, [id number]- Aquatic Therapy, [phone]- Electrical stimulation (unattended), [id number] Physical performance testing, 97016- Vasopneumatic device, [id number]- Ultrasound, [id number]- Traction (mechanical), [id number]- Ionotophoresis 4mg /ml Dexamethasone,  20560 - Needle insertion w/o injection 1 or 2 muscles, 20561 - Needle insertion w/o injection 3 or more muscles.    Patient/Family education, Balance training, Stair training, Taping, Dry Needling, Joint mobilization, Joint manipulation, Spinal manipulation, Spinal mobilization, Scar mobilization, Vestibular training, Visual/preceptual remediation/compensation, DME instructions, Cryotherapy, and Moist heat.  All performed as medically necessary.  All included unless  contraindicated  PLAN FOR NEXT SESSION: Review HEP knowledge/results.         Ismael Nap, Student-PT 11/30/2023, 7:56 AM

## 2023-12-06 ENCOUNTER — Ambulatory Visit (HOSPITAL_COMMUNITY): Admitting: Licensed Clinical Social Worker

## 2023-12-20 ENCOUNTER — Ambulatory Visit (INDEPENDENT_AMBULATORY_CARE_PROVIDER_SITE_OTHER): Admitting: Licensed Clinical Social Worker

## 2023-12-20 ENCOUNTER — Encounter (HOSPITAL_COMMUNITY): Payer: Self-pay

## 2023-12-20 DIAGNOSIS — F4323 Adjustment disorder with mixed anxiety and depressed mood: Secondary | ICD-10-CM | POA: Diagnosis not present

## 2023-12-20 NOTE — Progress Notes (Signed)
 Comprehensive Clinical Assessment (CCA) Note  12/20/2023 Jasmine Krause [id number]  Chief Complaint:  Chief Complaint  Patient presents with   Adjustment Disorder   Visit Diagnosis:  Encounter Diagnosis  Name Primary?   Adjustment disorder with mixed anxiety and depressed mood Yes   Summary: Koda is a 38yo, [place] American female, presenting for initial CCA, referred by Staci Kerns, NP following initial psych consult, in order to establish care for the ongoing management of presenting depressive and anxious sxs. Pt reports having observed onset of sxs approx. 1y ago, having maintained severity and frequency across areas of life. Pt reports stressors to include difficulties navigating emotions surrounding grief/loss of best friend who passed 2y ago, housing and relationship stress surrounding being the only one tending to household needs in the home, day-to-day financial stressors, and school and transition related stress surrounding completing school assignments and transitioning to other tasks/needs, and management of presenting sxs. Pt reports sxs to include increased irritability, anhedonia, avoidance, difficulties concentrating, worthlessness, tearfulness, sleep disturbances, and increased worrying. Pt denies SI, HI, AVH. Pt denies substance use concerns, noting of infrequent social drinking, with last use being 8/30 having 1x mixed drink. Pt denies prior OPT or INPT tx hx. Pt will benefit from engagement in OPT in efforts to improve individual management and/or amelioration of presenting MH sxs.       12/20/2023    8:13 AM 10/24/2023    8:59 AM 09/19/2023    9:07 AM 03/21/2023    8:24 AM  GAD 7 : Generalized Anxiety Score  Nervous, Anxious, on Edge 2 1 1    Control/stop worrying 2 1 2    Worry too much - different things 3 1 2    Trouble relaxing 2 1 1    Restless 1 0 1   Easily annoyed or irritable 3 3 2    Afraid - awful might happen 3 3 2    Total GAD 7 Score 16 10 11    Anxiety  Difficulty Somewhat difficult Somewhat difficult Somewhat difficult Not difficult at all      12/20/2023    8:14 AM 10/31/2023   12:54 PM 10/24/2023    8:57 AM 09/19/2023    9:06 AM 03/21/2023    8:24 AM  Depression screen PHQ 2/9  Decreased Interest 2 2 0 2 0  Down, Depressed, Hopeless 2 2 0 1 0  PHQ - 2 Score 4 4 0 3 0  Altered sleeping 2 2 0 2   Tired, decreased energy 2 2 1 2    Change in appetite 2 3 1 3    Feeling bad or failure about yourself  2 2 1 1    Trouble concentrating 2 1 1 1    Moving slowly or fidgety/restless 0 1 0 1   Suicidal thoughts 0 1 0 0   PHQ-9 Score 14 16 4 13    Difficult doing work/chores Somewhat difficult Somewhat difficult Somewhat difficult Very difficult    CCA Biopsychosocial Intake/Chief Complaint:  Adjustment D/O w/ Mixed Anxiety and Depressed moods. I'm always irritated, because I'm always the call on person, having to do everything for everyone else  Current Symptoms/Problems: I'm always irritable, not wanting to do anything, avoidance   Patient Reported Schizophrenia/Schizoaffective Diagnosis in Past: No   Strengths: Support from grandfather, taking care of people  Preferences: Prefer in-person and virtual (hybrid).  Abilities: Open to new approaches, open to feedback.   Type of Services Patient Feels are Needed: Individual therapy   Initial Clinical Notes/Concerns: 38yo African American female, referred for  intake CCA to establish care for support in management of presenting sxs. No prior OPT or INPT tx.   Mental Health Symptoms Depression:  Change in energy/activity; Difficulty Concentrating; Worthlessness; Irritability; Increase/decrease in appetite; Sleep (too much or little); Tearfulness   Duration of Depressive symptoms: Greater than two weeks (Onset approx. 1y ago.)   Mania:  N/A   Anxiety:   Irritability; Difficulty concentrating; Restlessness; Sleep; Worrying   Psychosis:  None   Duration of Psychotic symptoms: No data  recorded  Trauma:  N/A   Obsessions:  N/A   Compulsions:  N/A   Inattention:  N/A   Hyperactivity/Impulsivity:  N/A   Oppositional/Defiant Behaviors:  N/A   Emotional Irregularity:  Intense/unstable relationships; Mood lability   Other Mood/Personality Symptoms:  No data recorded   Mental Status Exam Appearance and self-care  Stature:  Average   Weight:  Obese   Clothing:  Casual   Grooming:  Well-groomed   Cosmetic use:  None   Posture/gait:  Normal   Motor activity:  Not Remarkable   Sensorium  Attention:  Normal   Concentration:  Normal   Orientation:  X5   Recall/memory:  No data recorded  Affect and Mood  Affect:  Depressed; Appropriate   Mood:  Depressed   Relating  Eye contact:  Normal   Facial expression:  Responsive   Attitude toward examiner:  Cooperative   Thought and Language  Speech flow: Clear and Coherent   Thought content:  Appropriate to Mood and Circumstances   Preoccupation:  None   Hallucinations:  None   Organization:  No data recorded  [employer] of Knowledge:  Average   Intelligence:  Average   Abstraction:  Normal   Judgement:  Good   Reality Testing:  Adequate   Insight:  Good   Decision Making:  Normal   Social Functioning  Social Maturity:  Responsible   Social Judgement:  Normal   Stress  Stressors:  Grief/losses; Housing; Surveyor, quantity; Relationship; School; Transitions (Grief/loss of best friend 2 years ago, housing stress surrounding having just relocated, day-to-day financial stress, relationship stress related to being the only one tending to household needs, school stress managing workload, transitioning from tasks)   Coping Ability:  Deficient supports; Exhausted; Overwhelmed   Skill Deficits:  Communication; Self-care   Supports:  Support needed; Family     Religion: Religion/Spirituality Are You A Religious Person?: Yes What is Your Religious Affiliation?:  Chiropodist: Leisure / Recreation Do You Have Hobbies?: Yes Leisure and Hobbies: Go out when I can, take my daughter somewhere, take the kids somewhere, on the weekends I go to my moms  Exercise/Diet: Exercise/Diet Do You Exercise?: Yes What Type of Exercise Do You Do?: Run/Walk How Many Times a Week Do You Exercise?: 1-3 times a week Have You Gained or Lost A Significant Amount of Weight in the Past Six Months?: No Do You Follow a Special Diet?: No Do You Have Any Trouble Sleeping?: Yes Explanation of Sleeping Difficulties: Difficulties staying asleep, avg. 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 Patient's Job has Been Impacted by Current Illness: No What is the Longest Time Patient has Held a Job?: 14 years Where was the Patient Employed at that Time?: Current Employer  Has Patient ever Been in the [employer]?: No  Education: Education Is Patient Currently Attending School?: Yes School Currently Attending: [employer] Last Grade Completed: 12 Name of High School: [employer] Did You Graduate From [employer]?: Yes Did Theme park manager?: Yes What Type of College Degree Do you Have?: Associates in Early childhood   CCA Family/Childhood History Family and Relationship History: Family history Marital status: Other (comment) (Complilcated) Are you sexually active?: Yes What is your sexual orientation?: Heterosexual Has your sexual activity been affected by drugs, alcohol, medication, or emotional stress?: No. Does patient have children?: Yes How many children?: 1 (11yo Daughter) How is patient's relationship  with their children?: Up and down, she's in that pre-teen stage  Childhood History:  Childhood History By whom was/is the patient raised?: Mother, Grandparents Additional childhood history information: Off and on relationship with father. Moved around a lot, grandparents raised me Description of patient's relationship with caregiver when they were a child: Very close, especially with granddad; We lived with them Patient's description of current relationship with people who raised him/her: The same How were you disciplined when you got in trouble as a child/adolescent?: To be honest I was never disciplined, I was a good child Does patient have siblings?: Yes Number of Siblings: 1 (27yo sister) Description of patient's current relationship with siblings: I can't say close because we don't talk every day, we do talk every now and then, no bad blood or anything Did patient suffer any verbal/emotional/physical/sexual abuse as a child?: No Did patient suffer from severe childhood neglect?: No Has patient ever been sexually abused/assaulted/raped as an adolescent or adult?: No Was the patient ever a victim of a crime or a disaster?: No Witnessed domestic violence?: No Has patient been affected by domestic violence as an adult?: No  CCA Substance Use Alcohol/Drug Use: Alcohol / Drug Use Pain Medications: None. Prescriptions: See MAR Over the Counter: Tylenol  PRN History of alcohol / drug use?: Yes Withdrawal Symptoms: None Substance #1 Name of Substance 1: Alcohol 1 - Age of First Use: 23 1 - Amount (size/oz): 1 Mixed drink 1 - Frequency: Socially 1 - Last Use / Amount: 12/16/23 1- Route of Use: PO  ASAM's:  Six Dimensions of Multidimensional Assessment  Dimension 1:  Acute Intoxication and/or Withdrawal Potential:      Dimension 2:  Biomedical Conditions and Complications:      Dimension 3:  Emotional, Behavioral, or Cognitive Conditions and Complications:     Dimension  4:  Readiness to Change:     Dimension 5:  Relapse, Continued use, or Continued Problem Potential:     Dimension 6:  Recovery/Living Environment:     ASAM Severity Score:    ASAM Recommended Level of Treatment:     Substance use Disorder (SUD)    Recommendations for Services/Supports/Treatments: Recommendations for Services/Supports/Treatments Recommendations For Services/Supports/Treatments: Individual Therapy  DSM5 Diagnoses: Patient Active Problem List   Diagnosis Date Noted   Cholecystitis 10/18/2021   Prediabetes 06/18/2020   Hyperlipidemia 06/18/2020   Morbid obesity (HCC) 05/15/2013    Patient Centered Plan: Patient is on the following Treatment Plan(s):  Anxiety and Depression   Referrals to Alternative Service(s): Referred to Alternative Service(s):   Place:   Date:   Time:    Referred to Alternative Service(s):   Place:   Date:   Time:    Referred to Alternative Service(s):   Place:   Date:   Time:  Referred to Alternative Service(s):   Place:   Date:   Time:      Collaboration of Care: Other None necessary at this time.  Patient/Guardian was advised Release of Information must be obtained prior to any record release in order to collaborate their care with an outside provider. Patient/Guardian was advised if they have not already done so to contact the registration department to sign all necessary forms in order for us  to release information regarding their care.   Consent: Patient/Guardian gives verbal consent for treatment and assignment of benefits for services provided during this visit. Patient/Guardian expressed understanding and agreed to proceed.   Lynwood JONETTA Maris, LCSW

## 2023-12-22 NOTE — Therapy (Addendum)
 OUTPATIENT PHYSICAL THERAPY EVALUATION   Patient Name: Jasmine Krause MRN: [id number] DOB:[birth year], 38 y.o., female Today's Date: 12/25/2023  END OF SESSION:  PT End of Session - 12/25/23 1439     Visit Number 1    Number of Visits 15    Date for PT Re-Evaluation 02/19/24    Authorization Type [employer] $15 copay - 15 VL    Authorization - Number of Visits 15    PT Start Time 1440    PT Stop Time 1507    PT Time Calculation (min) 27 min    Activity Tolerance Patient tolerated treatment well    Behavior During Therapy WFL for tasks assessed/performed          Past Medical History:  Diagnosis Date   Abnormal Pap smear of cervix    09-23-20 neg HPV HR+   High cholesterol    Obesity    Past Surgical History:  Procedure Laterality Date   CHOLECYSTECTOMY N/A 10/19/2021   Procedure: LAPAROSCOPIC CHOLECYSTECTOMY;  Surgeon: Dasie Leonor LITTIE, MD;  Location: [facility] OR;  Service: General;  Laterality: N/A;   Patient Active Problem List   Diagnosis Date Noted   Adjustment disorder with mixed anxiety and depressed mood 12/20/2023   Cholecystitis 10/18/2021   Prediabetes 06/18/2020   Hyperlipidemia 06/18/2020   Morbid obesity (HCC) 05/15/2013    PCP: Lorren Greig PARAS NP  REFERRING PROVIDER: Trudy Duwaine BRAVO, NP  REFERRING DIAG: M54.50,G89.29 (ICD-10-CM) - Chronic bilateral low back pain without sciatica M79.18 (ICD-10-CM) - Myofascial pain syndrome M47.817 (ICD-10-CM) - Spondylosis without myelopathy or radiculopathy, lumbosacral region  Rationale for Evaluation and Treatment: Rehabilitation  THERAPY DIAG:  Other low back pain  Abnormal posture  ONSET DATE: 2024   SUBJECTIVE:                                                                                                                                                                                           SUBJECTIVE STATEMENT: Pt indicated nagging pain in lower back.  Reported having it most every day.  Reported  stiffness in prolonged sitting activity.  Insidious onset about a year ago. Pt denied radicular symptoms.  Pt indicated having difficulty sleeping.    PERTINENT HISTORY:  Chronic > 1 year onset.  No prior PT, surgery . 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   PAIN:  NPRS scale: at worst 9/10, at current 5/10, at best 3/10 Pain location: back pain lumbar central Pain description: there, just hurts nagging  Aggravating factors: prolonged sitting activity, bending over, picking up.  Relieving factors: stopping painful activity   PRECAUTIONS: None  WEIGHT  BEARING RESTRICTIONS: No  FALLS:  Has patient fallen in last 6 months? No  LIVING ENVIRONMENT: Lives in: House/apartment Stairs: stairs for house no trouble indicated.  Has 36 yr old.   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   PLOF: Independent  PATIENT GOALS: Reduce pain   OBJECTIVE:   DIAGNOSTIC FINDINGS:  10/30/2023 xrays lumbar: AP and lateral radiographs of lumbar spine show increased lordosis, normal  segmentation, there is foraminal stenosis and disc height loss at L5-S1.  No spondylolisthesis. No pars defects. There are periarticular osteophytes  noted to bilateral hips, no significant joint space narrowing.   PATIENT SURVEYS:  Patient-Specific Activity Scoring Scheme  0 represents "unable to perform." 10 represents "able to perform at prior level. 0 1 2 3 4 5 6 7 8 9  10 (Date and Score)   Activity Eval  12/25/2023    1. Sitting prolonged 3    2. Getting up/down off floor  3    3. Sleep through night  5   4. Lifting/picking things up  5   5.    Score 4 avg    Total score = sum of the activity scores/number of activities Minimum detectable change (90%CI) for average score = 2 points Minimum detectable change (90%CI) for single activity score = 3 points  SCREENING FOR RED FLAGS: 12/25/2023 Bowel or bladder incontinence: No Cauda equina syndrome: No  COGNITION: 12/25/2023 Overall cognitive  status: WFL normal      SENSATION: 12/25/2023 Unremarkable   MUSCLE LENGTH: 12/25/2023 Passive hamstring SLR bilateral > 75 deg without back pain noted.   POSTURE:  12/25/2023 Equal iliac crest, no lateral shift noted in standing.  Mild increase in lumbar lordosis noted in standing.   PALPATION: 12/25/2023 Not observed  LUMBAR ROM:  12/25/2023 Directional Preference Assessment: Centralization: none observed Peripheralization: none observed  AROM Eval 12/25/2023  Flexion To toes without pain change   Extension 75% WFL with good feeling.  Repeated x 5   Right lateral flexion To knee joint without complaints  Left lateral flexion To femoral epicondyle with pain lumbar   Right rotation   Left rotation    (Blank rows = not tested)  LOWER EXTREMITY ROM:      Right Eval 12/25/2023 Left Eval 12/25/2023  Hip flexion    Hip extension    Hip abduction    Hip adduction    Hip internal rotation    Hip external rotation Supine PROM end range tightness in back Supine PROM end range pain in back  Knee flexion    Knee extension    Ankle dorsiflexion    Ankle plantarflexion    Ankle inversion    Ankle eversion     (Blank rows = not tested)  LOWER EXTREMITY MMT:    MMT Right Eval 12/25/2023 Left Eval 12/25/2023  Hip flexion 5/5 5/5  Hip extension    Hip abduction    Hip adduction    Hip internal rotation    Hip external rotation    Knee flexion 5/5 5/5  Knee extension 5/5 5/5  Ankle dorsiflexion    Ankle plantarflexion    Ankle inversion    Ankle eversion     (Blank rows = not tested)  LUMBAR SPECIAL TESTS:  12/25/2023 (-) slump  FUNCTIONAL TESTS:  12/25/2023 18 inch chair transfer s UE assist but with pain noted.   GAIT: 12/25/2023 Independent ambulation in clinic.  TODAY'S TREATMENT:                                                                                                         DATE: 12/25/2023  Therex:    HEP instruction/performance c cues for techniques, handout provided.  Trial set performed of each for comprehension and symptom assessment.  See below for exercise list  PATIENT EDUCATION:  12/25/2023 Education details: HEP, POC Person educated: Patient Education method: Explanation, Demonstration, Verbal cues, and Handouts Education comprehension: verbalized understanding, returned demonstration, and verbal cues required  HOME EXERCISE PROGRAM: Access Code: [email or web address] URL: [email or web address] Date: 12/25/2023 Prepared by: Ozell Silvan  Exercises - Standing Lumbar Extension  - 2-3 x daily - 7 x weekly - 1 sets - 5-10 reps - Supine Lower Trunk Rotation  - 2-3 x daily - 7 x weekly - 1 sets - 3-5 reps - 15 hold - Supine Figure 4 Piriformis Stretch  - 2-3 x daily - 7 x weekly - 1 sets - 5 reps - 30 hold - Supine Figure 4 pull towards  - 2-3 x daily - 7 x weekly - 1 sets - 5 reps - 15-30 hold - Supine Bridge  - 1-2 x daily - 7 x weekly - 1-2 sets - 10 reps - 2 hold - Seated Multifidi Isometric  - 1-2 x daily - 7 x weekly - 1 sets - 10 reps - 10 hold - Seated Abdominal Press into [employer]  - 1-2 x daily - 7 x weekly - 1 sets - 10 reps - 10 hold  ASSESSMENT:  CLINICAL IMPRESSION: Patient is a 38 y.o. who comes to clinic with complaints of chronic back pain with mobility, strength and movement coordination deficits that impair their ability to perform usual daily and recreational functional activities without increase difficulty/symptoms at this time.  Patient to benefit from skilled PT services to address impairments and limitations to improve to previous level of function without restriction secondary to condition.   OBJECTIVE IMPAIRMENTS: decreased activity tolerance, decreased coordination, decreased endurance, decreased  mobility, decreased ROM, increased fascial restrictions, impaired perceived functional ability, increased muscle spasms, impaired flexibility, improper body mechanics, postural dysfunction, and pain.   ACTIVITY LIMITATIONS: carrying, lifting, bending, sitting, sleeping, and caring for others  PARTICIPATION LIMITATIONS: meal prep, cleaning, laundry, interpersonal relationship, community activity, and occupation  PERSONAL FACTORS: Time since onset of injury/illness/exacerbation are also affecting patient's functional outcome.   REHAB POTENTIAL: Good  CLINICAL DECISION MAKING: Stable/uncomplicated  EVALUATION COMPLEXITY: Low   GOALS: Goals reviewed with patient? Yes  SHORT TERM GOALS: (target date for Short term goals are 3 weeks 01/15/2024)  1. Patient will demonstrate independent use of home exercise program to maintain progress from in clinic treatments.  Goal status: New  LONG TERM GOALS: (target dates for all long term goals are 8 weeks  02/19/2024 )   1. Patient will demonstrate/report pain at worst less than or equal to 2/10 to facilitate minimal limitation in daily activity secondary to pain symptoms.  Goal status: New   2. Patient will demonstrate independent  use of home exercise program to facilitate ability to maintain/progress functional gains from skilled physical therapy services.  Goal status: New   3. Patient will demonstrate Patient specific functional scale avg > or = 8/10 to indicate reduced disability due to condition.   Goal status: New   4. Patient will demonstrate lumbar extension 100 % WFL s symptoms to facilitate upright standing, walking posture at PLOF s limitation.  Goal status: New   5.  Patient will demonstrate/report ability to get on and off floor to perform work activity s restriction.   Goal status: New   6. Patient will demonstrate/report ability to sleep through night s restriction due to symptoms Goal Status: new  PLAN:  PT FREQUENCY:  1-2x/week  PT DURATION: 8weeks  (15 visit limit by insurance)  PLANNED INTERVENTIONS: Can include 02853- PT Re-evaluation, 97110-Therapeutic exercises, 97530- Therapeutic activity, [id number]- Neuromuscular re-education, 97535- Self Care, 97140- Manual therapy, [id number]- Gait training, [phone]- Orthotic Fit/training, [phone]- Canalith repositioning, [id number]- Aquatic Therapy, [id number]- Electrical stimulation (unattended), [id number] Physical performance testing, 97016- Vasopneumatic device, [id number]- Ultrasound, [id number]- Traction (mechanical), [id number]- Ionotophoresis 4mg /ml Dexamethasone,  20560 - Needle insertion w/o injection 1 or 2 muscles, 20561 - Needle insertion w/o injection 3 or more muscles.    Patient/Family education, Balance training, Stair training, Taping, Dry Needling, Joint mobilization, Joint manipulation, Spinal manipulation, Spinal mobilization, Scar mobilization, Vestibular training, Visual/preceptual remediation/compensation, DME instructions, Cryotherapy, and Moist heat.  All performed as medically necessary.  All included unless contraindicated  PLAN FOR NEXT SESSION: Review HEP knowledge/results.      Ozell Silvan, PT, DPT, OCS, ATC 12/25/23  3:09 PM    Addendum to update PSFS scoring correctly (Patient had inverted the scale) Ozell Silvan, PT, DPT, OCS, ATC 01/15/24  3:28 PM

## 2023-12-25 ENCOUNTER — Ambulatory Visit: Admitting: Physical Medicine and Rehabilitation

## 2023-12-25 ENCOUNTER — Encounter: Payer: Self-pay | Admitting: Rehabilitative and Restorative Service Providers"

## 2023-12-25 ENCOUNTER — Ambulatory Visit: Admitting: Rehabilitative and Restorative Service Providers"

## 2023-12-25 DIAGNOSIS — M5459 Other low back pain: Secondary | ICD-10-CM

## 2023-12-25 DIAGNOSIS — R293 Abnormal posture: Secondary | ICD-10-CM

## 2023-12-29 ENCOUNTER — Ambulatory Visit: Payer: No Typology Code available for payment source | Admitting: Radiology

## 2023-12-29 ENCOUNTER — Other Ambulatory Visit: Payer: Self-pay | Admitting: Physical Medicine and Rehabilitation

## 2023-12-30 ENCOUNTER — Other Ambulatory Visit: Payer: Self-pay | Admitting: Radiology

## 2023-12-30 DIAGNOSIS — Z3041 Encounter for surveillance of contraceptive pills: Secondary | ICD-10-CM

## 2024-01-01 ENCOUNTER — Encounter: Payer: Self-pay | Admitting: Physical Medicine and Rehabilitation

## 2024-01-01 ENCOUNTER — Ambulatory Visit: Admitting: Physical Medicine and Rehabilitation

## 2024-01-01 DIAGNOSIS — M47817 Spondylosis without myelopathy or radiculopathy, lumbosacral region: Secondary | ICD-10-CM | POA: Diagnosis not present

## 2024-01-01 DIAGNOSIS — G8929 Other chronic pain: Secondary | ICD-10-CM | POA: Diagnosis not present

## 2024-01-01 DIAGNOSIS — M545 Low back pain, unspecified: Secondary | ICD-10-CM

## 2024-01-01 DIAGNOSIS — M7918 Myalgia, other site: Secondary | ICD-10-CM

## 2024-01-01 NOTE — Progress Notes (Unsigned)
 Pain Scale   Average Pain 7 Patient advising she hs chronic lower back pain and PT didn't help her pain        +Driver, -BT, -Dye Allergies.

## 2024-01-01 NOTE — Progress Notes (Unsigned)
 Jasmine Krause - 38 y.o. female MRN [id number]  Date of birth: [birth year]  Office Visit Note: Visit Date: 01/01/2024 PCP: Jasmine Greig PARAS, NP Referred by: Jasmine Greig PARAS, NP  Subjective: Chief Complaint  Patient presents with   Lower Back - Pain   HPI: Jasmine Krause is a 38 y.o. female who comes in today for evaluation of chronic, worsening and severe bilateral lower back pain. She is here today in follow up. She recently attended initial evaluation of formal physical therapy last week. She is planning to return for continued treatment. Her pain started about 1 year ago, worsens with activity and bending. She describes pain as tight and aching sensation, currently rates as 7 out of 10. Recent lumbar radiographs show increased lordosis, there is foraminal stenosis and disc height loss at L5-S1 that is advanced for her age. No prior lumbar MRI imaging. Patient denies focal weakness, numbness and tingling. No recent trauma or falls.      Review of Systems  Musculoskeletal:  Positive for back pain and myalgias.  Neurological:  Negative for tingling, sensory change, focal weakness and weakness.  All other systems reviewed and are negative.  Otherwise per HPI.  Assessment & Plan: Visit Diagnoses:    ICD-10-CM   1. Chronic bilateral low back pain without sciatica  M54.50 MR LUMBAR SPINE WO CONTRAST   G89.29     2. Spondylosis without myelopathy or radiculopathy, lumbosacral region  M47.817 MR LUMBAR SPINE WO CONTRAST    3. Myofascial pain syndrome  M79.18 MR LUMBAR SPINE WO CONTRAST       Plan: Findings:  Chronic, worsening and severe bilateral lower back pain. No radicular symptoms down the legs. Patient continues to have pain despite good conservative therapies such as formal physical therapy, home exercise regimen, rest and use of medications. Patients clinical presentation and exam are complex, differentials include discogenic back pain/myofascial pain. We discussed treatment plan  in detail today. I would like her to continue with formal physical therapy. I do think she would benefit from possible dry needling and establishing home exercise regimen.I also placed order for lumbar MRI imaging. I will see her back for lumbar MRI review and discuss further treatment options. No red flag symptoms noted upon exam today.     Meds & Orders: No orders of the defined types were placed in this encounter.   Orders Placed This Encounter  Procedures   MR LUMBAR SPINE WO CONTRAST    Follow-up: Return for Lumbar MRI review.   Procedures: No procedures performed      Clinical History: No specialty comments available.   She reports that she has quit smoking. She has been exposed to tobacco smoke. She has never used smokeless tobacco.  Recent Labs    06/19/23 0943 09/19/23 0929 10/24/23 0921  HGBA1C 6.8 7.6* 8.1*    Objective:  VS:  HT:    WT:   BMI:     BP:   HR: bpm  TEMP: ( )  RESP:  Physical Exam Vitals and nursing note reviewed.  HENT:     Head: Normocephalic and atraumatic.     Right Ear: External ear normal.     Left Ear: External ear normal.     Nose: Nose normal.     Mouth/Throat:     Mouth: Mucous membranes are moist.  Eyes:     Extraocular Movements: Extraocular movements intact.  Cardiovascular:     Rate and Rhythm: Normal rate.  Pulses: Normal pulses.  Pulmonary:     Effort: Pulmonary effort is normal.  Abdominal:     General: Abdomen is flat. There is no distension.  Musculoskeletal:        General: Tenderness present.     Cervical back: Normal range of motion.     Comments: Patient rises from seated position to standing without difficulty. Good lumbar range of motion. No pain noted with facet loading. 5/5 strength noted with bilateral hip flexion, knee flexion/extension, ankle dorsiflexion/plantarflexion and EHL. No clonus noted bilaterally. No pain upon palpation of greater trochanters. No pain with internal/external rotation of bilateral  hips. Sensation intact bilaterally. Myofascial tenderness noted upon palpation of bilateral lumbar paraspinal regions. Negative slump test bilaterally. Ambulates without aid, gait steady.     Skin:    General: Skin is warm and dry.     Capillary Refill: Capillary refill takes less than 2 seconds.  Neurological:     General: No focal deficit present.     Mental Status: She is alert and oriented to person, place, and time.  Psychiatric:        Mood and Affect: Mood normal.        Behavior: Behavior normal.     Ortho Exam  Imaging: No results found.  Past Medical/Family/Surgical/Social History: Medications & Allergies reviewed per EMR, new medications updated. Patient Active Problem List   Diagnosis Date Noted   Adjustment disorder with mixed anxiety and depressed mood 12/20/2023   Cholecystitis 10/18/2021   Prediabetes 06/18/2020   Hyperlipidemia 06/18/2020   Morbid obesity (HCC) 05/15/2013   Past Medical History:  Diagnosis Date   Abnormal Pap smear of cervix    09-23-20 neg HPV HR+   High cholesterol    Obesity    Family History  Problem Relation Age of Onset   Diabetes Mother    Hypertension Mother    Diabetes Maternal Grandmother    Hypertension Maternal Grandmother    Other Neg Hx    Past Surgical History:  Procedure Laterality Date   CHOLECYSTECTOMY N/A 10/19/2021   Procedure: LAPAROSCOPIC CHOLECYSTECTOMY;  Surgeon: Dasie Leonor CROME, MD;  Location: [facility] OR;  Service: General;  Laterality: N/A;   Social History   Occupational History   Occupation: TODDLER TEACHER    Employer: [employer]  Tobacco Use   Smoking status: Former    Passive exposure: Past   Smokeless tobacco: Never  Vaping Use   Vaping status: Former  Substance and Sexual Activity   Alcohol use: Not Currently   Drug use: No   Sexual activity: Not Currently    Partners: Male    Birth control/protection: Pill    Comment: intercourse age 64, less than 5 sexual pafrtners, CURRENT PARTNER- 9  YRS

## 2024-01-01 NOTE — Telephone Encounter (Signed)
.  Med refill request: Junel FE  Last OV: 05/31/23 Last AEX: 12/27/22 Next AEX: No scheduled  Last MMG (if hormonal med) NA Refill authorized: Please Advise?

## 2024-01-04 ENCOUNTER — Ambulatory Visit (HOSPITAL_COMMUNITY): Admitting: Licensed Clinical Social Worker

## 2024-01-08 ENCOUNTER — Ambulatory Visit
Admission: EM | Admit: 2024-01-08 | Discharge: 2024-01-08 | Disposition: A | Discharge status: Home / Self Care | Attending: Nurse Practitioner | Admitting: Nurse Practitioner

## 2024-01-08 DIAGNOSIS — N611 Abscess of the breast and nipple: Secondary | ICD-10-CM

## 2024-01-08 MED ORDER — SULFAMETHOXAZOLE-TRIMETHOPRIM 800-160 MG PO TABS: 1.0000 | ORAL_TABLET | Freq: Two times a day (BID) | ORAL | 0 refills | Status: AC

## 2024-01-08 NOTE — ED Provider Notes (Signed)
 [name]    CSN: [id number] Arrival date & time: 01/08/24  1526      History   Chief Complaint Chief Complaint  Patient presents with   Skin Problem    HPI Jasmine Krause is a 38 y.o. female.   Discussed the use of AI scribe software for clinical note transcription with the patient, who gave verbal consent to proceed.   Patient presents with a self-described monster eye on my boob. The patient reports noticing the lesion on 3 days ago as being located on her right breast, with recent onset of soreness. She reports that it has drained a little bit and describes it as just so hard. The patient has been applying hot compresses to the area. She denies any manipulation of the lesion beyond the hot compress application. The patient also denies experiencing any fevers, chills, or body aches associated with the condition.  The patient's medical history is significant for diabetes, with a reported last A1c of 8.9 in July, which was noted to be trending upward from previous values of 7.6 and 7.1. Patient has no other symptoms or complaints.   The following sections of the patient's history were reviewed and updated as appropriate: allergies, current medications, past family history, past medical history, past social history, past surgical history, and problem list.      Past Medical History:  Diagnosis Date   Abnormal Pap smear of cervix    09-23-20 neg HPV HR+   High cholesterol    Obesity     Patient Active Problem List   Diagnosis Date Noted   Adjustment disorder with mixed anxiety and depressed mood 12/20/2023   Cholecystitis 10/18/2021   Prediabetes 06/18/2020   Hyperlipidemia 06/18/2020   Morbid obesity (HCC) 05/15/2013    Past Surgical History:  Procedure Laterality Date   CHOLECYSTECTOMY N/A 10/19/2021   Procedure: LAPAROSCOPIC CHOLECYSTECTOMY;  Surgeon: Dasie Leonor LITTIE, MD;  Location: [facility] OR;  Service: General;  Laterality: N/A;    OB History      Gravida  1   Para  1   Term  1   Preterm  0   AB  0   Living  1      SAB  0   IAB  0   Ectopic  0   Multiple  0   Live Births  1            Home Medications    Prior to Admission medications   Medication Sig Start Date End Date Taking? Authorizing Provider  insulin  glargine (LANTUS  SOLOSTAR) 100 UNIT/ML Solostar Pen ADMINISTER 40 UNITS UNDER THE SKIN DAILY. 10/25/23  Yes Lorren, Amy J, NP  sulfamethoxazole -trimethoprim  (BACTRIM  DS) 800-160 MG tablet Take 1 tablet by mouth 2 (two) times daily for 7 days. 01/08/24 01/15/24 Yes Iola Lukes, FNP  Accu-Chek Softclix Lancets lancets use as directed to check blood sugar 10/21/21   Vicci Burnard SAUNDERS, PA-C  amLODipine  (NORVASC ) 5 MG tablet Take 1 tablet (5 mg total) by mouth daily. 10/24/23   Lorren Greig PARAS, NP  atorvastatin  (LIPITOR) 20 MG tablet TAKE 1 TABLET(20 MG) BY MOUTH DAILY 09/19/23   Lorren Greig PARAS, NP  blood glucose meter kit and supplies Use up to four times daily as directed. 10/21/21   Vicci Burnard SAUNDERS, PA-C  Dulaglutide  (TRULICITY ) 1.5 MG/0.5ML SOAJ Inject 1.5 mg into the skin once a week. Patient not taking: Reported on 10/24/2023 06/19/23   Lorren Greig PARAS, NP  glucose blood test  strip use to check blood sugars up to four times daily 06/19/23   Lorren Greig PARAS, NP  ibuprofen  (ADVIL ) 800 MG tablet Take 1 tablet (800 mg total) by mouth every 8 (eight) hours as needed. 10/23/23   Lorren Greig PARAS, NP  Insulin  Pen Needle (PENTIPS) 32G X 4 MM MISC Use to inject insulin  as directed. 02/23/22   Lorren Greig PARAS, NP  meloxicam  (MOBIC ) 15 MG tablet TAKE 1 TABLET(15 MG) BY MOUTH DAILY 12/29/23   Williams, Megan E, NP  metFORMIN  (GLUCOPHAGE -XR) 500 MG 24 hr tablet Take 2 tablets (1,000 mg total) by mouth daily with breakfast. 10/24/23 01/22/24  Lorren Greig PARAS, NP  norethindrone-ethinyl estradiol-iron (JUNEL FE 1.5/30) 1.5-30 MG-MCG tablet TAKE 1 TABLET BY MOUTH DAILY 01/01/24   Chrzanowski, Jami B, NP  polyethylene glycol powder  (GLYCOLAX /MIRALAX ) 17 GM/SCOOP powder TAKE 17G BY MOUTH AS NEEDED FOR MILD CONSTIPATION OR MODERATE CONSTIPATION 05/19/23   Lorren Greig PARAS, NP    Family History Family History  Problem Relation Age of Onset   Diabetes Mother    Hypertension Mother    Diabetes Maternal Grandmother    Hypertension Maternal Grandmother    Other Neg Hx     Social History Social History   Tobacco Use   Smoking status: Former    Types: Cigarettes    Passive exposure: Past   Smokeless tobacco: Never  Vaping Use   Vaping status: Never Used  Substance Use Topics   Alcohol use: Not Currently   Drug use: No     Allergies   Lisinopril    Review of Systems Review of Systems  Constitutional:  Negative for chills and fever.  Musculoskeletal:  Negative for myalgias.  Skin:  Positive for wound.  All other systems reviewed and are negative.    Physical Exam Triage Vital Signs ED Triage Vitals  Encounter Vitals Group     BP 01/08/24 1707 138/81     Girls Systolic BP Percentile --      Girls Diastolic BP Percentile --      Boys Systolic BP Percentile --      Boys Diastolic BP Percentile --      Pulse Rate 01/08/24 1707 90     Resp 01/08/24 1707 20     Temp 01/08/24 1707 98.2 F (36.8 C)     Temp Source 01/08/24 1707 Oral     SpO2 01/08/24 1707 97 %     Weight 01/08/24 1704 (!) 316 lb (143.3 kg)     Height 01/08/24 1704 5' 8.5 (1.74 m)     Head Circumference --      Peak Flow --      Pain Score 01/08/24 1703 10     Pain Loc --      Pain Education --      Exclude from Growth Chart --    No data found.  Updated Vital Signs BP 138/81 (BP Location: Left Arm)   Pulse 90   Temp 98.2 F (36.8 C) (Oral)   Resp 20   Ht 5' 8.5 (1.74 m)   Wt (!) 316 lb (143.3 kg)   LMP  (LMP Unknown)   SpO2 97%   BMI 47.35 kg/m   Visual Acuity Right Eye Distance:   Left Eye Distance:   Bilateral Distance:    Right Eye Near:   Left Eye Near:    Bilateral Near:     Physical Exam Vitals  reviewed. Exam conducted with a chaperone present [place], RN).  Constitutional:  General: She is awake. She is not in acute distress.    Appearance: Normal appearance. She is well-developed. She is obese. She is not ill-appearing, toxic-appearing or diaphoretic.  HENT:     Head: Normocephalic.     Right Ear: Hearing normal.     Left Ear: Hearing normal.     Nose: Nose normal.     Mouth/Throat:     Mouth: Mucous membranes are moist.  Eyes:     General: Vision grossly intact.     Conjunctiva/sclera: Conjunctivae normal.  Cardiovascular:     Rate and Rhythm: Normal rate and regular rhythm.     Heart sounds: Normal heart sounds.  Pulmonary:     Effort: Pulmonary effort is normal.     Breath sounds: Normal breath sounds and air entry.  Chest:     Comments: Abscess noted of the right lateral breast. Underlying fluctuance noted. No increased warmth. Mild surrounding erythema (see image below)  Musculoskeletal:        General: Normal range of motion.     Cervical back: Normal range of motion and neck supple.  Skin:    General: Skin is warm and dry.     Findings: Abscess present.     Comments: See image below   Neurological:     General: No focal deficit present.     Mental Status: She is alert and oriented to person, place, and time.  Psychiatric:        Speech: Speech normal.        Behavior: Behavior is cooperative.      UC Treatments / Results  Labs (all labs ordered are listed, but only abnormal results are displayed) Labs Reviewed  AEROBIC CULTURE W GRAM STAIN (SUPERFICIAL SPECIMEN)    EKG   Radiology No results found.  Procedures Incision and Drainage  Date/Time: 01/08/2024 5:48 PM  Performed by: Iola Lukes, FNP Authorized by: Iola Lukes, FNP   Consent:    Consent obtained:  Verbal   Consent given by:  Patient   Risks, benefits, and alternatives were discussed: yes     Risks discussed:  Incomplete drainage, pain and infection Universal  protocol:    Patient identity confirmed:  Verbally with patient Location:    Type:  Abscess   Location:  Trunk   Trunk location:  R breast Pre-procedure details:    Skin preparation:  Chlorhexidine  with alcohol and povidone-iodine Sedation:    Sedation type:  None Anesthesia:    Anesthesia method:  Local infiltration   Local anesthetic:  Lidocaine  2% w/o epi Procedure type:    Complexity:  Complex Procedure details:    Incision types:  Single straight   Wound management:  Probed and deloculated   Drainage:  Purulent   Drainage amount:  Copious   Packing materials:  1/2 in iodoform gauze Post-procedure details:    Procedure completion:  Tolerated well, no immediate complications  (including critical care time)  Medications Ordered in UC Medications - No data to display  Initial Impression / Assessment and Plan / UC Course  I have reviewed the triage vital signs and the nursing notes.  Pertinent labs & imaging results that were available during my care of the patient were reviewed by me and considered in my medical decision making (see chart for details).     Patient presents with an abscess to the right breast without evidence of systemic illness. An incision and drainage procedure was performed in clinic with return of purulent material. The cavity  was irrigated and packed with gauze for continued drainage. A wound culture was obtained and sent for laboratory analysis. Patient was started on antibiotics to provide additional coverage and instructed to use warm compresses at home to promote healing. Return precautions were reviewed, including worsening redness, swelling, pain, drainage, or fever, which would warrant reevaluation. Patient advised to return for packing removal and wound check in 2 days.   Today's evaluation has revealed no signs of a dangerous process. Discussed diagnosis with patient and/or guardian. Patient and/or guardian aware of their diagnosis, possible red  flag symptoms to watch out for and need for close follow up. Patient and/or guardian understands verbal and written discharge instructions. Patient and/or guardian comfortable with plan and disposition.  Patient and/or guardian has a clear mental status at this time, good insight into illness (after discussion and teaching) and has clear judgment to make decisions regarding their care  Documentation was completed with the aid of voice recognition software. Transcription may contain typographical errors.  Final Clinical Impressions(s) / UC Diagnoses   Final diagnoses:  Abscess of right breast     Discharge Instructions      You were seen today for an abscess.  An abscess is an infection under the skin that can happen anywhere on the body. If not treated, it can get worse and cause a more serious infection.  Today, we performed a procedure called an I&D, which stands for incision and drainage. This means a small cut was made to let the infection and pus drain out. The area was cleaned thoroughly, and a gauze packing was placed inside the open area to help it heal properly. This packing should stay in place until you return for your follow-up visit. A sample of the drainage was sent to the lab to check for any bacteria.  You have been prescribed antibiotics. Be sure to take them exactly as directed, and always take them with food to help avoid an upset stomach. Keep using warm, moist compresses on the area to help with healing.   Watch the area closely for any signs that the infection is getting worse, such as increased redness, swelling, pain, pus, or fever. If you notice any of these symptoms, contact your doctor, return to urgent care or go to the ED right away.      ED Prescriptions     Medication Sig Dispense Auth. Provider   sulfamethoxazole -trimethoprim  (BACTRIM  DS) 800-160 MG tablet Take 1 tablet by mouth 2 (two) times daily for 7 days. 14 tablet Iola Lukes, FNP      PDMP  not reviewed this encounter.   Iola Lukes, [place] 01/08/24 [phone]

## 2024-01-08 NOTE — ED Triage Notes (Signed)
 Patient reports Monster eye on my right boob. No fever.

## 2024-01-08 NOTE — Discharge Instructions (Addendum)
 You were seen today for an abscess.  An abscess is an infection under the skin that can happen anywhere on the body. If not treated, it can get worse and cause a more serious infection.  Today, we performed a procedure called an I&D, which stands for incision and drainage. This means a small cut was made to let the infection and pus drain out. The area was cleaned thoroughly, and a gauze packing was placed inside the open area to help it heal properly. This packing should stay in place until you return for your follow-up visit. A sample of the drainage was sent to the lab to check for any bacteria.  You have been prescribed antibiotics. Be sure to take them exactly as directed, and always take them with food to help avoid an upset stomach. Keep using warm, moist compresses on the area to help with healing.   Watch the area closely for any signs that the infection is getting worse, such as increased redness, swelling, pain, pus, or fever. If you notice any of these symptoms, contact your doctor, return to urgent care or go to the ED right away.

## 2024-01-10 ENCOUNTER — Ambulatory Visit: Payer: Self-pay | Admitting: Nurse Practitioner

## 2024-01-10 ENCOUNTER — Telehealth: Payer: Self-pay

## 2024-01-10 ENCOUNTER — Other Ambulatory Visit: Payer: Self-pay | Admitting: Family

## 2024-01-10 ENCOUNTER — Encounter: Payer: Self-pay | Admitting: Emergency Medicine

## 2024-01-10 ENCOUNTER — Ambulatory Visit

## 2024-01-10 ENCOUNTER — Encounter: Admitting: Physical Therapy

## 2024-01-10 ENCOUNTER — Ambulatory Visit: Admission: EM | Admit: 2024-01-10 | Discharge: 2024-01-10 | Disposition: A | Discharge status: Home / Self Care

## 2024-01-10 DIAGNOSIS — I10 Essential (primary) hypertension: Secondary | ICD-10-CM

## 2024-01-10 DIAGNOSIS — Z5189 Encounter for other specified aftercare: Secondary | ICD-10-CM

## 2024-01-10 MED ORDER — AMLODIPINE BESYLATE 5 MG PO TABS: 5.0000 mg | ORAL_TABLET | Freq: Every day | ORAL | 0 refills | Status: AC

## 2024-01-10 NOTE — Telephone Encounter (Signed)
 Complete

## 2024-01-10 NOTE — ED Triage Notes (Signed)
 Pt presents requesting removal of packing from right breast. Pt reports no issues with the site aside from the fact tat the tape is itchy.

## 2024-01-10 NOTE — Telephone Encounter (Signed)
 Refill request for amlodipine  5 mg received via fax from [place] on [address].

## 2024-01-10 NOTE — ED Provider Notes (Signed)
 [name]    CSN: [id number] Arrival date & time: 01/10/24  1524      History   Chief Complaint Chief Complaint  Patient presents with   Packing removal     HPI Jasmine Krause is a 38 y.o. female.   Discussed the use of AI scribe software for clinical note transcription with the patient, who gave verbal consent to proceed.   The patient presents for re-evaluation of an abscess on the right lateral breast. She was seen 2 days ago and underwent incision and drainage with packing. She was started on Bactrim  DS twice daily for 7 days. Wound culture to date shows no growth, though gram stain demonstrated few WBCs, predominantly PMNs, with moderate Gram-positive cocci, which is a common finding in bacterial skin and soft tissue infections. The final culture report is still pending. Patient reports that she is tolerating the antibiotics and doing well overall.   The following sections of the patient's history were reviewed and updated as appropriate: allergies, current medications, past family history, past medical history, past social history, past surgical history, and problem list.     Past Medical History:  Diagnosis Date   Abnormal Pap smear of cervix    09-23-20 neg HPV HR+   High cholesterol    Obesity     Patient Active Problem List   Diagnosis Date Noted   Adjustment disorder with mixed anxiety and depressed mood 12/20/2023   Cholecystitis 10/18/2021   Prediabetes 06/18/2020   Hyperlipidemia 06/18/2020   Morbid obesity (HCC) 05/15/2013    Past Surgical History:  Procedure Laterality Date   CHOLECYSTECTOMY N/A 10/19/2021   Procedure: LAPAROSCOPIC CHOLECYSTECTOMY;  Surgeon: Dasie Leonor LITTIE, MD;  Location: [facility] OR;  Service: General;  Laterality: N/A;    OB History     Gravida  1   Para  1   Term  1   Preterm  0   AB  0   Living  1      SAB  0   IAB  0   Ectopic  0   Multiple  0   Live Births  1            Home Medications     Prior to Admission medications   Medication Sig Start Date End Date Taking? Authorizing Provider  Accu-Chek Softclix Lancets lancets use as directed to check blood sugar 10/21/21   Vicci Burnard SAUNDERS, PA-C  amLODipine  (NORVASC ) 5 MG tablet Take 1 tablet (5 mg total) by mouth daily. 01/10/24   Lorren Greig PARAS, NP  atorvastatin  (LIPITOR) 20 MG tablet TAKE 1 TABLET(20 MG) BY MOUTH DAILY 09/19/23   Lorren Greig PARAS, NP  blood glucose meter kit and supplies Use up to four times daily as directed. 10/21/21   Vicci Burnard SAUNDERS, PA-C  Dulaglutide  (TRULICITY ) 1.5 MG/0.5ML SOAJ Inject 1.5 mg into the skin once a week. Patient not taking: Reported on 10/24/2023 06/19/23   Lorren Greig PARAS, NP  glucose blood test strip use to check blood sugars up to four times daily 06/19/23   Lorren Greig PARAS, NP  ibuprofen  (ADVIL ) 800 MG tablet Take 1 tablet (800 mg total) by mouth every 8 (eight) hours as needed. 10/23/23   Lorren Greig PARAS, NP  insulin  glargine (LANTUS  SOLOSTAR) 100 UNIT/ML Solostar Pen ADMINISTER 40 UNITS UNDER THE SKIN DAILY. 10/25/23   Lorren Greig PARAS, NP  Insulin  Pen Needle (PENTIPS) 32G X 4 MM MISC Use to inject insulin  as directed. 02/23/22  Lorren, Amy J, NP  meloxicam  (MOBIC ) 15 MG tablet TAKE 1 TABLET(15 MG) BY MOUTH DAILY 12/29/23   Williams, Megan E, NP  metFORMIN  (GLUCOPHAGE -XR) 500 MG 24 hr tablet Take 2 tablets (1,000 mg total) by mouth daily with breakfast. 10/24/23 01/22/24  Lorren Greig PARAS, NP  norethindrone-ethinyl estradiol-iron (JUNEL FE 1.5/30) 1.5-30 MG-MCG tablet TAKE 1 TABLET BY MOUTH DAILY 01/01/24   Chrzanowski, Jami B, NP  polyethylene glycol powder (GLYCOLAX /MIRALAX ) 17 GM/SCOOP powder TAKE 17G BY MOUTH AS NEEDED FOR MILD CONSTIPATION OR MODERATE CONSTIPATION 05/19/23   Lorren Greig PARAS, NP  sulfamethoxazole -trimethoprim  (BACTRIM  DS) 800-160 MG tablet Take 1 tablet by mouth 2 (two) times daily for 7 days. 01/08/24 01/15/24  Iola Lukes, FNP    Family History Family History  Problem Relation Age  of Onset   Diabetes Mother    Hypertension Mother    Diabetes Maternal Grandmother    Hypertension Maternal Grandmother    Other Neg Hx     Social History Social History   Tobacco Use   Smoking status: Former    Types: Cigarettes    Passive exposure: Past   Smokeless tobacco: Never  Vaping Use   Vaping status: Never Used  Substance Use Topics   Alcohol use: Not Currently   Drug use: No     Allergies   Lisinopril    Review of Systems Review of Systems  Constitutional:  Negative for fever.  Gastrointestinal:  Negative for nausea and vomiting.  Skin:  Positive for wound.  All other systems reviewed and are negative.    Physical Exam Triage Vital Signs ED Triage Vitals  Encounter Vitals Group     BP 01/10/24 1639 (!) 141/85     Girls Systolic BP Percentile --      Girls Diastolic BP Percentile --      Boys Systolic BP Percentile --      Boys Diastolic BP Percentile --      Pulse Rate 01/10/24 1639 84     Resp 01/10/24 1639 20     Temp 01/10/24 1639 98.1 F (36.7 C)     Temp Source 01/10/24 1639 Oral     SpO2 01/10/24 1639 98 %     Weight 01/10/24 1638 (!) 315 lb 14.7 oz (143.3 kg)     Height --      Head Circumference --      Peak Flow --      Pain Score 01/10/24 1638 0     Pain Loc --      Pain Education --      Exclude from Growth Chart --    No data found.  Updated Vital Signs BP (!) 141/85 (BP Location: Left Arm)   Pulse 84   Temp 98.1 F (36.7 C) (Oral)   Resp 20   Wt (!) 315 lb 14.7 oz (143.3 kg)   LMP 12/15/2023 (Approximate)   SpO2 98%   BMI 47.34 kg/m   Visual Acuity Right Eye Distance:   Left Eye Distance:   Bilateral Distance:    Right Eye Near:   Left Eye Near:    Bilateral Near:     Physical Exam Vitals reviewed.  Constitutional:      General: She is awake. She is not in acute distress.    Appearance: Normal appearance. She is well-developed. She is obese. She is not ill-appearing, toxic-appearing or diaphoretic.  HENT:      Head: Normocephalic.     Right Ear: Hearing normal.  Left Ear: Hearing normal.     Nose: Nose normal.     Mouth/Throat:     Mouth: Mucous membranes are moist.  Eyes:     General: Vision grossly intact.     Conjunctiva/sclera: Conjunctivae normal.  Cardiovascular:     Rate and Rhythm: Normal rate and regular rhythm.     Heart sounds: Normal heart sounds.  Pulmonary:     Effort: Pulmonary effort is normal.     Breath sounds: Normal breath sounds and air entry.  Musculoskeletal:        General: Normal range of motion.     Cervical back: Normal range of motion and neck supple.  Skin:    General: Skin is warm and dry.     Comments: Packing from the abscess on the lateral aspect of the right breast was removed easily and without complication. No purulent drainage was expressed, and there is no surrounding erythema. Mild firmness is noted superiorly to the wound, but no fluctuance is present. Wound covered with dry, dressing.   Neurological:     General: No focal deficit present.     Mental Status: She is alert and oriented to person, place, and time.  Psychiatric:        Speech: Speech normal.        Behavior: Behavior is cooperative.      UC Treatments / Results  Labs (all labs ordered are listed, but only abnormal results are displayed) Labs Reviewed - No data to display  EKG   Radiology No results found.  Procedures Procedures (including critical care time)  Medications Ordered in UC Medications - No data to display  Initial Impression / Assessment and Plan / UC Course  I have reviewed the triage vital signs and the nursing notes.  Pertinent labs & imaging results that were available during my care of the patient were reviewed by me and considered in my medical decision making (see chart for details).    The patient returns for wound check following I&D of a breast abscess. On exam today, the wound is healing well with no purulent drainage and no surrounding  erythema. Current results show no bacterial growth, but gram stain demonstrates moderate Gram-positive cocci, consistent with prior abscess findings; final culture results remain pending. The patient is clinically improving and will continue Bactrim  DS as prescribed to complete the 7-day course. Supportive care with warm compresses was reviewed, and it is safe for the patient to get the wound wet in the shower as long as the area is patted dry afterward and covered with a clean bandage until fully closed. She was instructed to monitor for signs of infection, including new swelling, fever, or increasing pain, and to return promptly if these occur.  Today's evaluation has revealed no signs of a dangerous process. Discussed diagnosis with patient and/or guardian. Patient and/or guardian aware of their diagnosis, possible red flag symptoms to watch out for and need for close follow up. Patient and/or guardian understands verbal and written discharge instructions. Patient and/or guardian comfortable with plan and disposition.  Patient and/or guardian has a clear mental status at this time, good insight into illness (after discussion and teaching) and has clear judgment to make decisions regarding their care  Documentation was completed with the aid of voice recognition software. Transcription may contain typographical errors.  Final Clinical Impressions(s) / UC Diagnoses   Final diagnoses:  Admission for wound check of abscess     Discharge Instructions  You were seen today for a wound check of the abscess on your right breast. The area looks like it is healing well. There is no pus coming out and no redness around the skin. The culture so far shows no bacteria growing, but the first stain showed bacteria that are commonly seen with skin infections. The final report is still pending. Please continue taking your antibiotic (Bactrim  DS) twice a day until all doses are finished, even if the wound looks  better. You may use warm compresses to help with healing. It is okay to get the wound wet in the shower--just pat the area dry afterward and cover it with a clean bandage until it closes completely. Watch for warning signs of infection, including new swelling, fever, worsening pain, or pus draining from the wound. If these occur, come back to the clinic or go to the ER.     ED Prescriptions   None    PDMP not reviewed this encounter.   Iola Lukes, [place] 01/10/24 1734

## 2024-01-10 NOTE — Discharge Instructions (Addendum)
 You were seen today for a wound check of the abscess on your right breast. The area looks like it is healing well. There is no pus coming out and no redness around the skin. The culture so far shows no bacteria growing, but the first stain showed bacteria that are commonly seen with skin infections. The final report is still pending. Please continue taking your antibiotic (Bactrim  DS) twice a day until all doses are finished, even if the wound looks better. You may use warm compresses to help with healing. It is okay to get the wound wet in the shower--just pat the area dry afterward and cover it with a clean bandage until it closes completely. Watch for warning signs of infection, including new swelling, fever, worsening pain, or pus draining from the wound. If these occur, come back to the clinic or go to the ER.

## 2024-01-11 LAB — AEROBIC CULTURE W GRAM STAIN (SUPERFICIAL SPECIMEN): Culture: NO GROWTH

## 2024-01-11 NOTE — Telephone Encounter (Signed)
 I called patient to make her aware that her prescription has been sent to pharmacy no one answered and I was unable to leave a voicemail

## 2024-01-12 NOTE — Telephone Encounter (Signed)
 I called patient and no one answered so I left a voicemail to return my call

## 2024-01-15 ENCOUNTER — Ambulatory Visit (INDEPENDENT_AMBULATORY_CARE_PROVIDER_SITE_OTHER): Admitting: Rehabilitative and Restorative Service Providers"

## 2024-01-15 ENCOUNTER — Encounter: Payer: Self-pay | Admitting: Rehabilitative and Restorative Service Providers"

## 2024-01-15 DIAGNOSIS — R293 Abnormal posture: Secondary | ICD-10-CM

## 2024-01-15 DIAGNOSIS — M5459 Other low back pain: Secondary | ICD-10-CM

## 2024-01-15 NOTE — Therapy (Addendum)
 OUTPATIENT PHYSICAL THERAPY TREATMENT / DISCHARGE   Patient Name: Jasmine Krause MRN: [id number] DOB:May 10, 1985, 38 y.o., female Today's Date: 01/15/2024  END OF SESSION:  PT End of Session - 01/15/24 1526     Visit Number 2    Number of Visits 15    Date for Recertification  02/19/24    Authorization Type [employer] $15 copay - 15 VL    Authorization - Number of Visits 15    PT Start Time 1520    PT Stop Time 1558    PT Time Calculation (min) 38 min    Activity Tolerance Patient tolerated treatment well    Behavior During Therapy WFL for tasks assessed/performed           Past Medical History:  Diagnosis Date   Abnormal Pap smear of cervix    09-23-20 neg HPV HR+   High cholesterol    Obesity    Past Surgical History:  Procedure Laterality Date   CHOLECYSTECTOMY N/A 10/19/2021   Procedure: LAPAROSCOPIC CHOLECYSTECTOMY;  Surgeon: Dasie Leonor LITTIE, MD;  Location: [facility] OR;  Service: General;  Laterality: N/A;   Patient Active Problem List   Diagnosis Date Noted   Adjustment disorder with mixed anxiety and depressed mood 12/20/2023   Cholecystitis 10/18/2021   Prediabetes 06/18/2020   Hyperlipidemia 06/18/2020   Morbid obesity (HCC) 05/15/2013    PCP: Lorren Greig PARAS NP  REFERRING PROVIDER: Trudy Duwaine BRAVO, NP  REFERRING DIAG: M54.50,G89.29 (ICD-10-CM) - Chronic bilateral low back pain without sciatica M79.18 (ICD-10-CM) - Myofascial pain syndrome M47.817 (ICD-10-CM) - Spondylosis without myelopathy or radiculopathy, lumbosacral region  Rationale for Evaluation and Treatment: Rehabilitation  THERAPY DIAG:  Other low back pain  Abnormal posture  ONSET DATE: 2024   SUBJECTIVE:                                                                                                                                                                                           SUBJECTIVE STATEMENT: Pt indicated feeling overall about the same, some short term relief with  HEP.   PERTINENT HISTORY:  Chronic > 1 year onset.  No prior PT, surgery . 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   PAIN:  NPRS scale: up to 7/10, 0/10 upon arrival today.  Pain location: back pain lumbar central Pain description: there, just hurts nagging  Aggravating factors: prolonged sitting activity, bending over, picking up.  Relieving factors: stopping painful activity   PRECAUTIONS: None  WEIGHT BEARING RESTRICTIONS: No  FALLS:  Has patient fallen in last 6 months? No  LIVING ENVIRONMENT: Lives in: House/apartment Stairs: stairs for house  no trouble indicated.  Has 36 yr old.   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   PLOF: Independent  PATIENT GOALS: Reduce pain   OBJECTIVE:   DIAGNOSTIC FINDINGS:  10/30/2023 xrays lumbar: AP and lateral radiographs of lumbar spine show increased lordosis, normal  segmentation, there is foraminal stenosis and disc height loss at L5-S1.  No spondylolisthesis. No pars defects. There are periarticular osteophytes  noted to bilateral hips, no significant joint space narrowing.   PATIENT SURVEYS:  Patient-Specific Activity Scoring Scheme  0 represents "unable to perform." 10 represents "able to perform at prior level. 0 1 2 3 4 5 6 7 8 9  10 (Date and Score)   Activity Eval  12/25/2023  01/15/2024  1. Sitting prolonged  3 7   2. Getting up/down off floor  3 5   3. Sleep through night  5 5  4. Lifting/picking things up  5 5  5.    Score 4 avg 5.5 avg   Total score = sum of the activity scores/number of activities Minimum detectable change (90%CI) for average score = 2 points Minimum detectable change (90%CI) for single activity score = 3 points  SCREENING FOR RED FLAGS: 12/25/2023 Bowel or bladder incontinence: No Cauda equina syndrome: No  COGNITION: 12/25/2023 Overall cognitive status: WFL normal      SENSATION: 12/25/2023 Unremarkable   MUSCLE LENGTH: 12/25/2023 Passive hamstring SLR bilateral > 75 deg  without back pain noted.   POSTURE:  12/25/2023 Equal iliac crest, no lateral shift noted in standing.  Mild increase in lumbar lordosis noted in standing.   PALPATION: 12/25/2023 Not observed  LUMBAR ROM:  12/25/2023 Directional Preference Assessment: Centralization: none observed Peripheralization: none observed  AROM Eval 12/25/2023 01/15/2024  Flexion To toes without pain change    Extension 75% WFL with good feeling.  Repeated x 5  100% WFL  Right lateral flexion To knee joint without complaints   Left lateral flexion To femoral epicondyle with pain lumbar    Right rotation    Left rotation     (Blank rows = not tested)  LOWER EXTREMITY ROM:      Right Eval 12/25/2023 Left Eval 12/25/2023  Hip flexion    Hip extension    Hip abduction    Hip adduction    Hip internal rotation    Hip external rotation Supine PROM end range tightness in back Supine PROM end range pain in back  Knee flexion    Knee extension    Ankle dorsiflexion    Ankle plantarflexion    Ankle inversion    Ankle eversion     (Blank rows = not tested)  LOWER EXTREMITY MMT:    MMT Right Eval 12/25/2023 Left Eval 12/25/2023  Hip flexion 5/5 5/5  Hip extension    Hip abduction    Hip adduction    Hip internal rotation    Hip external rotation    Knee flexion 5/5 5/5  Knee extension 5/5 5/5  Ankle dorsiflexion    Ankle plantarflexion    Ankle inversion    Ankle eversion     (Blank rows = not tested)  LUMBAR SPECIAL TESTS:  12/25/2023 (-) slump  FUNCTIONAL TESTS:  12/25/2023 18 inch chair transfer s UE assist but with pain noted.   GAIT: 12/25/2023 Independent ambulation in clinic.  TODAY'S TREATMENT:                                                                                                          DATE: 01/15/2024  Therex: Nustep lvl 6 10 mins UE/LE for ROM/aerobic intervention.  Education on aerobic benefit for back pain.  Lumbar trunk rotation stretch 15 sec x 3 bilateral  Supine figure 4 pull towards 30 sec x 3 bilateral  Supine figure 4 push away 30 sec x 3 bilateral  Supine hooklying clam shell blue band with contralateral leg isometric hold focus x 20 bilateral  Supine pball press down 10 sec x 10  Supine hooklying pball reach up mini crunch x 15  Standing lumbar extension AROM x 5    TODAY'S TREATMENT:                                                                                                         DATE: 12/25/2023  Therex:    HEP instruction/performance c cues for techniques, handout provided.  Trial set performed of each for comprehension and symptom assessment.  See below for exercise list  PATIENT EDUCATION:  12/25/2023 Education details: HEP, POC Person educated: Patient Education method: Explanation, Demonstration, Verbal cues, and Handouts Education comprehension: verbalized understanding, returned demonstration, and verbal cues required  HOME EXERCISE PROGRAM: Access Code: [email or web address] URL: [email or web address] Date: 12/25/2023 Prepared by: Ozell Silvan  Exercises - Standing Lumbar Extension  - 2-3 x daily - 7 x weekly - 1 sets - 5-10 reps - Supine Lower Trunk Rotation  - 2-3 x daily - 7 x weekly - 1 sets - 3-5 reps - 15 hold - Supine Figure 4 Piriformis Stretch  - 2-3 x daily - 7 x weekly - 1 sets - 5 reps - 30 hold - Supine Figure 4 pull towards  - 2-3 x daily - 7 x weekly - 1 sets - 5 reps - 15-30 hold - Supine Bridge  - 1-2 x daily - 7 x weekly - 1-2 sets - 10 reps - 2 hold - Seated Multifidi Isometric  - 1-2 x daily - 7 x weekly - 1 sets - 10 reps - 10 hold - Seated Abdominal Press into [employer]  - 1-2 x daily - 7 x weekly - 1 sets - 10 reps - 10 hold  ASSESSMENT:  CLINICAL IMPRESSION: PSFS reassessment showed mild  improvements in 2 items. Continued encouragement for routine HEP use.  Symptoms still noted and impactful for daily life. Continued skilled PT services indicated at this time.   OBJECTIVE IMPAIRMENTS: decreased activity tolerance, decreased coordination, decreased endurance, decreased mobility, decreased ROM,  increased fascial restrictions, impaired perceived functional ability, increased muscle spasms, impaired flexibility, improper body mechanics, postural dysfunction, and pain.   ACTIVITY LIMITATIONS: carrying, lifting, bending, sitting, sleeping, and caring for others  PARTICIPATION LIMITATIONS: meal prep, cleaning, laundry, interpersonal relationship, community activity, and occupation  PERSONAL FACTORS: Time since onset of injury/illness/exacerbation are also affecting patient's functional outcome.   REHAB POTENTIAL: Good  CLINICAL DECISION MAKING: Stable/uncomplicated  EVALUATION COMPLEXITY: Low   GOALS: Goals reviewed with patient? Yes  SHORT TERM GOALS: (target date for Short term goals are 3 weeks 01/15/2024)  1. Patient will demonstrate independent use of home exercise program to maintain progress from in clinic treatments.  Goal status: Met  LONG TERM GOALS: (target dates for all long term goals are 8 weeks  02/19/2024 )   1. Patient will demonstrate/report pain at worst less than or equal to 2/10 to facilitate minimal limitation in daily activity secondary to pain symptoms.  Goal status: on going 01/15/2024   2. Patient will demonstrate independent use of home exercise program to facilitate ability to maintain/progress functional gains from skilled physical therapy services.  Goal status: on going 01/15/2024   3. Patient will demonstrate Patient specific functional scale avg > or = 8/10 to indicate reduced disability due to condition.   Goal status: on going 01/15/2024   4. Patient will demonstrate lumbar extension 100 % WFL s symptoms to facilitate upright standing,  walking posture at PLOF s limitation.  Goal status: on going 01/15/2024   5.  Patient will demonstrate/report ability to get on and off floor to perform work activity s restriction.   Goal status: on going 01/15/2024   6. Patient will demonstrate/report ability to sleep through night s restriction due to symptoms Goal Status: on going 01/15/2024  PLAN:  PT FREQUENCY: 1-2x/week  PT DURATION: 8weeks  (15 visit limit by insurance)  PLANNED INTERVENTIONS: Can include 02853- PT Re-evaluation, 97110-Therapeutic exercises, 97530- Therapeutic activity, 97112- Neuromuscular re-education, 97535- Self Care, 97140- Manual therapy, [phone]- Gait training, [phone]- Orthotic Fit/training, [phone]- Canalith repositioning, [id number]- Aquatic Therapy, [phone]- Electrical stimulation (unattended), [id number] Physical performance testing, 97016- Vasopneumatic device, [id number]- Ultrasound, [id number]- Traction (mechanical), [id number]- Ionotophoresis 4mg /ml Dexamethasone,  20560 - Needle insertion w/o injection 1 or 2 muscles, 20561 - Needle insertion w/o injection 3 or more muscles.    Patient/Family education, Balance training, Stair training, Taping, Dry Needling, Joint mobilization, Joint manipulation, Spinal manipulation, Spinal mobilization, Scar mobilization, Vestibular training, Visual/preceptual remediation/compensation, DME instructions, Cryotherapy, and Moist heat.  All performed as medically necessary.  All included unless contraindicated  PLAN FOR NEXT SESSION: Continued strengthening, lumbar mobility gains.    Ozell Silvan, PT, DPT, OCS, ATC 01/15/24  4:06 PM   PHYSICAL THERAPY DISCHARGE SUMMARY  Visits from Start of Care: 2  Current functional level related to goals / functional outcomes: See note   Remaining deficits: See note   Education / Equipment: HEP  Patient goals were partially met. Patient is being discharged due to not returning since the last visit.  Ozell Silvan, PT, DPT, OCS, ATC 02/27/24  1:38  PM

## 2024-01-17 ENCOUNTER — Ambulatory Visit (INDEPENDENT_AMBULATORY_CARE_PROVIDER_SITE_OTHER): Admitting: Licensed Clinical Social Worker

## 2024-01-17 DIAGNOSIS — F4323 Adjustment disorder with mixed anxiety and depressed mood: Secondary | ICD-10-CM

## 2024-01-17 NOTE — Progress Notes (Signed)
 THERAPIST PROGRESS NOTE   Session Date: 01/17/2024  Session Time: 1516 - 1630  Participation Level: Active  MSE/Presentation: Behavior: Appropriate and Sharing Speech: Normal Thought Process: Coherent and Relevant Cognition: Alert and Appropriate Mood: Euthymic Affect: Congruent Insight: Improving Appearance: Casual  Type of Therapy: Individual Therapy  Treatment Goals addressed:   Initial (6) STG: Reduce overall depression score by a minimum of 25% on the Patient Health Questionnaire (PHQ-9) (OP Depression) STG: Jasmine Krause will identify cognitive patterns and beliefs that support depression (OP Depression) LTG: Prioritize my own needs more, AEB engaging in self-care activities at least 1x/month (OP Depression) LTG: Increase activity level AEB increasing frequency of exercise and engaging in social activities at least 2x/week (OP Depression) STG: Report a decrease in anxiety symptoms as evidenced by an overall reduction in anxiety score by a minimum of 25% on the Generalized Anxiety Disorder Scale (GAD-7) (Anxiety) LTG: Resume involvement in church by attending every first "Sunday of the month (Anxiety)  Progress Towards Goals: Initial  Interventions: CBT, Motivational Interviewing, and Solution Focused  Summary: Jasmine Krause is a 38 y.o. female with psych hx consistent with Adjustment disorder with mixed anxiety and depressed mood, presenting for follow-up therapy session in efforts to improve management of presenting depressive and anxious symptoms.   Patient openly engaged in session, presenting in pleasant moods, and congruent affect throughout duration of visit. Patient actively engaged in introductory check-in, sharing of things going well over the past four weeks since previous visit, sharing events of recent birthday, and navigating day-to-day. 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 Pt shared of own level of comfort with students needs and prior experience proving to improve individual management of stressor. Actively engaged in exploration of individual efforts at increasing attention focused on self-care and scheduling frequent self-care practices to support self and manage stress. Pt shared of additional stress experienced surrounding current nature of relationship with daughter's father, and uncertainty of status of relationship and further difficulties experienced in interactions, processing benefits to journaling thoughts in efforts to gather perspectives in order to attempt to navigate concerns within relationship. Pt expressed interest in obtaining own skates in efforts to accompany friend and increase activity level. Pt further shared individual efforts made towards attending church as previously shared to be desired, proving unsuccessful in doing so over the past month, with intent to do so this coming weekend.     01/17/2024    3:38 PM 12/20/2023    8:13 AM 10/24/2023    8:59 AM 09/19/2023    9:07 AM  GAD 7 : Generalized Anxiety Score  Nervous, Anxious, on Edge 1 2 1 1  Control/stop worrying 3 2 1 2  Worry too much - different things 3 3 1 2  Trouble relaxing 1 2 1 1  Restless 0 1 0 1  Easily annoyed or irritable 1 3 3 2  Afraid - awful might happen 3 3 3 2  Total GAD 7 Score 12 16 10 11  Anxiety Difficulty Not difficult at all Somewhat difficult Somewhat difficult Somewhat difficult      10" /04/2023    3:40 PM 12/20/2023    8:14 AM 10/31/2023   12:54 PM 10/24/2023    8:57 AM 09/19/2023    9:06 AM  Depression screen PHQ 2/9  Decreased Interest  1 2 2  0 2  Down, Depressed, Hopeless 1 2 2  0 1  PHQ - 2 Score 2 4 4  0 3  Altered sleeping 1 2 2  0 2  Tired, decreased energy 1 2 2 1 2   Change in appetite 0 2 3 1 3   Feeling bad or failure about  yourself  0 2 2 1 1   Trouble concentrating 1 2 1 1 1   Moving slowly or fidgety/restless 0 0 1 0 1  Suicidal thoughts 0 0 1 0 0  PHQ-9 Score 5 14 16 4 13   Difficult doing work/chores Not difficult at all Somewhat difficult Somewhat difficult Somewhat difficult Very difficult   Flowsheet Row UC from 01/10/2024 in [facility] UC from 01/08/2024 in [facility] Office Visit [facility]/31/2023 [facility]  C-SSRS RISK CATEGORY No Risk No Risk Error: Question 6 not populated    Suicidal/Homicidal: None, No plan to harm self or others  Therapist Response:   Clinician actively greeted pt upon presenting for visit, assessing presenting moods and affect, prompting engagement in check-in and detailing of daily events. Utilized open ended questions in evoking detailed recounts of events of the past month since previous session, exploring newly presenting stressors, recurring stressors, and individual efforts of managing such challenges over recent weeks. Utilized active listening techniques in supporting pt's recounts of events, providing support and validation of shared thoughts and feelings surrounding stressors, and individual efforts at managing challenges. Utilized CBT, MI, Psycho-Ed, and supportive reflection techniques in aiding pt in processing presenting stressors.   [x]  Cognitive Challenging []  Cognitive Refocusing [x]  Cognitive Reframing  [x]  Communication Skills []  Compliance Issues []  DBT []  Exploration of Coping Patterns [x]  Exploration of Emotions [x]  Exploration of Relationship Patterns []  Guided Imagery []  Interactive Feedback []  Interpersonal Resolutions []  Mindfulness Training []  Preventative Services [x]  Psycho-Education  []  Relaxation/Deep Breathing []  Review of Treatment Plan/Progress []  Role-Play/Behavioral Rehearsal  [x]  Structured Problem Solving [x]  Supportive  Reflection [x]  Symptom Management  []  Other    Clinician reassessed severity of presenting sxs, and presence of any safety concerns. Patient responded well to interventions. Patient continues to meet criteria for Adjustment disorder with mixed anxiety and depressed mood. Patient will continue to benefit from engagement in outpatient therapy due to being the least restrictive service to meet presenting needs.   Homework: Begin journaling to aid in gathering thoughts and perspectives surrounding relationship stress and how to approach challenging discussions with partner.  Plan: Return again in 2 weeks.  Diagnosis:  Encounter Diagnosis  Name Primary?   Adjustment disorder with mixed anxiety and depressed mood Yes    Collaboration of Care: Other none necessary at this time.  Patient/Guardian was advised Release of Information must be obtained prior to any record release in order to collaborate their care with an outside provider. Patient/Guardian was advised if they have not already done so to contact the registration department to sign all necessary forms in order for us  to release information regarding their care.   Consent: Patient/Guardian gives verbal consent for treatment and assignment of benefits for services provided during this visit. Patient/Guardian expressed understanding and agreed to proceed.   Lynwood JONETTA Maris, MSW, LCSW 01/17/2024,  3:42 PM

## 2024-01-18 ENCOUNTER — Other Ambulatory Visit

## 2024-01-22 ENCOUNTER — Encounter: Admitting: Physical Therapy

## 2024-01-29 ENCOUNTER — Encounter: Admitting: Physical Therapy

## 2024-01-30 ENCOUNTER — Other Ambulatory Visit: Payer: Self-pay | Admitting: Physical Medicine and Rehabilitation

## 2024-01-31 ENCOUNTER — Ambulatory Visit (HOSPITAL_COMMUNITY): Admitting: Licensed Clinical Social Worker

## 2024-02-02 ENCOUNTER — Encounter: Admitting: Family

## 2024-02-02 NOTE — Progress Notes (Signed)
 Erroneous encounter-disregard

## 2024-02-05 ENCOUNTER — Encounter: Admitting: Physical Therapy

## 2024-02-06 ENCOUNTER — Ambulatory Visit: Admitting: Radiology

## 2024-02-10 ENCOUNTER — Other Ambulatory Visit

## 2024-02-14 ENCOUNTER — Ambulatory Visit (HOSPITAL_COMMUNITY): Admitting: Licensed Clinical Social Worker

## 2024-02-19 ENCOUNTER — Encounter: Payer: Self-pay | Admitting: Radiology

## 2024-02-25 ENCOUNTER — Other Ambulatory Visit: Payer: Self-pay | Admitting: Radiology

## 2024-02-25 DIAGNOSIS — Z3041 Encounter for surveillance of contraceptive pills: Secondary | ICD-10-CM

## 2024-02-26 NOTE — Telephone Encounter (Signed)
.  Med refill request: Junel  Last AEX: 12/27/22 Next AEX: not scheduled, has cancelled appt. Sent a message to the front for scheduling.   Last MMG (if hormonal med) 08/01/23 Refill authorized: Please Advise?

## 2024-02-27 ENCOUNTER — Encounter: Admitting: Family

## 2024-02-27 NOTE — Progress Notes (Signed)
 Erroneous encounter-disregard

## 2024-03-18 ENCOUNTER — Other Ambulatory Visit: Payer: Self-pay

## 2024-03-18 DIAGNOSIS — Z3041 Encounter for surveillance of contraceptive pills: Secondary | ICD-10-CM

## 2024-03-18 NOTE — Telephone Encounter (Signed)
 Med refill request: Junel fe  Last AEX: last OV 05/31/23  Next AEX: not scheduled message sent to fd to get pt rescheduled  Last MMG (if hormonal med) Refill authorized: last rx 01/01/24 #[id number] with 0 refills.  Please Advise?

## 2024-03-19 MED ORDER — NORETHIN ACE-ETH ESTRAD-FE 1.5-30 MG-MCG PO TABS: 1.0000 | ORAL_TABLET | Freq: Every day | ORAL | 0 refills | Status: DC

## 2024-03-21 ENCOUNTER — Other Ambulatory Visit: Payer: Self-pay | Admitting: Emergency Medicine

## 2024-03-21 DIAGNOSIS — E785 Hyperlipidemia, unspecified: Secondary | ICD-10-CM

## 2024-03-21 NOTE — Telephone Encounter (Signed)
 Refill request

## 2024-03-22 MED ORDER — ATORVASTATIN CALCIUM 20 MG PO TABS: ORAL_TABLET | ORAL | 0 refills | Status: AC

## 2024-03-22 NOTE — Telephone Encounter (Signed)
 Complete

## 2024-04-17 ENCOUNTER — Ambulatory Visit: Admitting: Radiology

## 2024-05-07 ENCOUNTER — Other Ambulatory Visit: Payer: Self-pay | Admitting: Obstetrics and Gynecology

## 2024-05-07 DIAGNOSIS — Z3041 Encounter for surveillance of contraceptive pills: Secondary | ICD-10-CM

## 2024-05-08 NOTE — Telephone Encounter (Signed)
 Medication refill request: junel fe 1.5/30 Last AEX:  12-27-22, pap was 05-31-23 Next AEX: 05-24-24 Last MMG (if hormonal medication request): 08-01-23 Refill authorized: supply sent with 0 refills

## 2024-05-24 ENCOUNTER — Other Ambulatory Visit: Payer: Self-pay | Admitting: Physical Medicine and Rehabilitation

## 2024-05-24 ENCOUNTER — Ambulatory Visit: Payer: Self-pay | Admitting: Radiology

## 2024-07-15 ENCOUNTER — Ambulatory Visit: Payer: Self-pay | Admitting: Radiology
